# Patient Record
Sex: Female | Born: 1990 | Race: White | Hispanic: No | Marital: Single | Smoking: Never smoker
Health system: Southern US, Community
[De-identification: ages and names within clinical notes are randomized; demographics above are authoritative.]

## PROBLEM LIST (undated history)

## (undated) DIAGNOSIS — F419 Anxiety disorder, unspecified: Secondary | ICD-10-CM

## (undated) DIAGNOSIS — F32A Depression, unspecified: Secondary | ICD-10-CM

## (undated) DIAGNOSIS — F329 Major depressive disorder, single episode, unspecified: Secondary | ICD-10-CM

## (undated) HISTORY — PX: OTHER SURGICAL HISTORY: SHX169

---
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
## 2010-05-09 ENCOUNTER — Emergency Department (HOSPITAL_COMMUNITY): Admission: EM | Admit: 2010-05-09 | Discharge: 2010-05-09 | Payer: Self-pay | Admitting: Emergency Medicine

## 2010-11-26 LAB — DIFFERENTIAL
Lymphocytes Relative: 7 % — ABNORMAL LOW (ref 12–46)
Lymphs Abs: 1.2 10*3/uL (ref 0.7–4.0)
Neutrophils Relative %: 90 % — ABNORMAL HIGH (ref 43–77)

## 2010-11-26 LAB — URINALYSIS, ROUTINE W REFLEX MICROSCOPIC
Bilirubin Urine: NEGATIVE
Ketones, ur: 15 mg/dL — AB
Nitrite: NEGATIVE
Protein, ur: NEGATIVE mg/dL
pH: 5.5 (ref 5.0–8.0)

## 2010-11-26 LAB — POCT PREGNANCY, URINE

## 2010-11-26 LAB — BASIC METABOLIC PANEL
CO2: 21 mEq/L (ref 19–32)
Calcium: 9.3 mg/dL (ref 8.4–10.5)
GFR calc non Af Amer: >60 mL/min (ref >60)
Sodium: 137 mEq/L (ref 135–145)

## 2010-11-26 LAB — CBC
MCV: 92.2 fL (ref 78.0–100.0)
Platelets: 261 10*3/uL (ref 150–400)
RBC: 4.02 MIL/uL (ref 3.87–5.11)
WBC: 16.3 10*3/uL — ABNORMAL HIGH (ref 4.0–10.5)

## 2010-11-26 LAB — URINE MICROSCOPIC-ADD ON

## 2010-11-28 LAB — URINALYSIS, ROUTINE W REFLEX MICROSCOPIC
Glucose, UA: NEGATIVE mg/dL
Specific Gravity, Urine: 1.025 (ref 1.005–1.030)
pH: 6 (ref 5.0–8.0)

## 2010-11-28 LAB — GC/CHLAMYDIA PROBE AMP, GENITAL

## 2010-11-28 LAB — POCT I-STAT, CHEM 8
BUN: 4 mg/dL — ABNORMAL LOW (ref 6–23)
Chloride: 105 mEq/L (ref 96–112)
Sodium: 141 mEq/L (ref 135–145)

## 2010-11-28 LAB — WET PREP, GENITAL
Trich, Wet Prep: NONE SEEN
Yeast Wet Prep HPF POC: NONE SEEN
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
## 2010-12-08 IMAGING — CR DG HAND 2V*L*
2 series · 2 of 2 positions shown · non-contrast
Comparison: None

CLINICAL DATA: Pain after trauma

LEFT HAND - 2 VIEW

[view not recorded (1 of 2)]
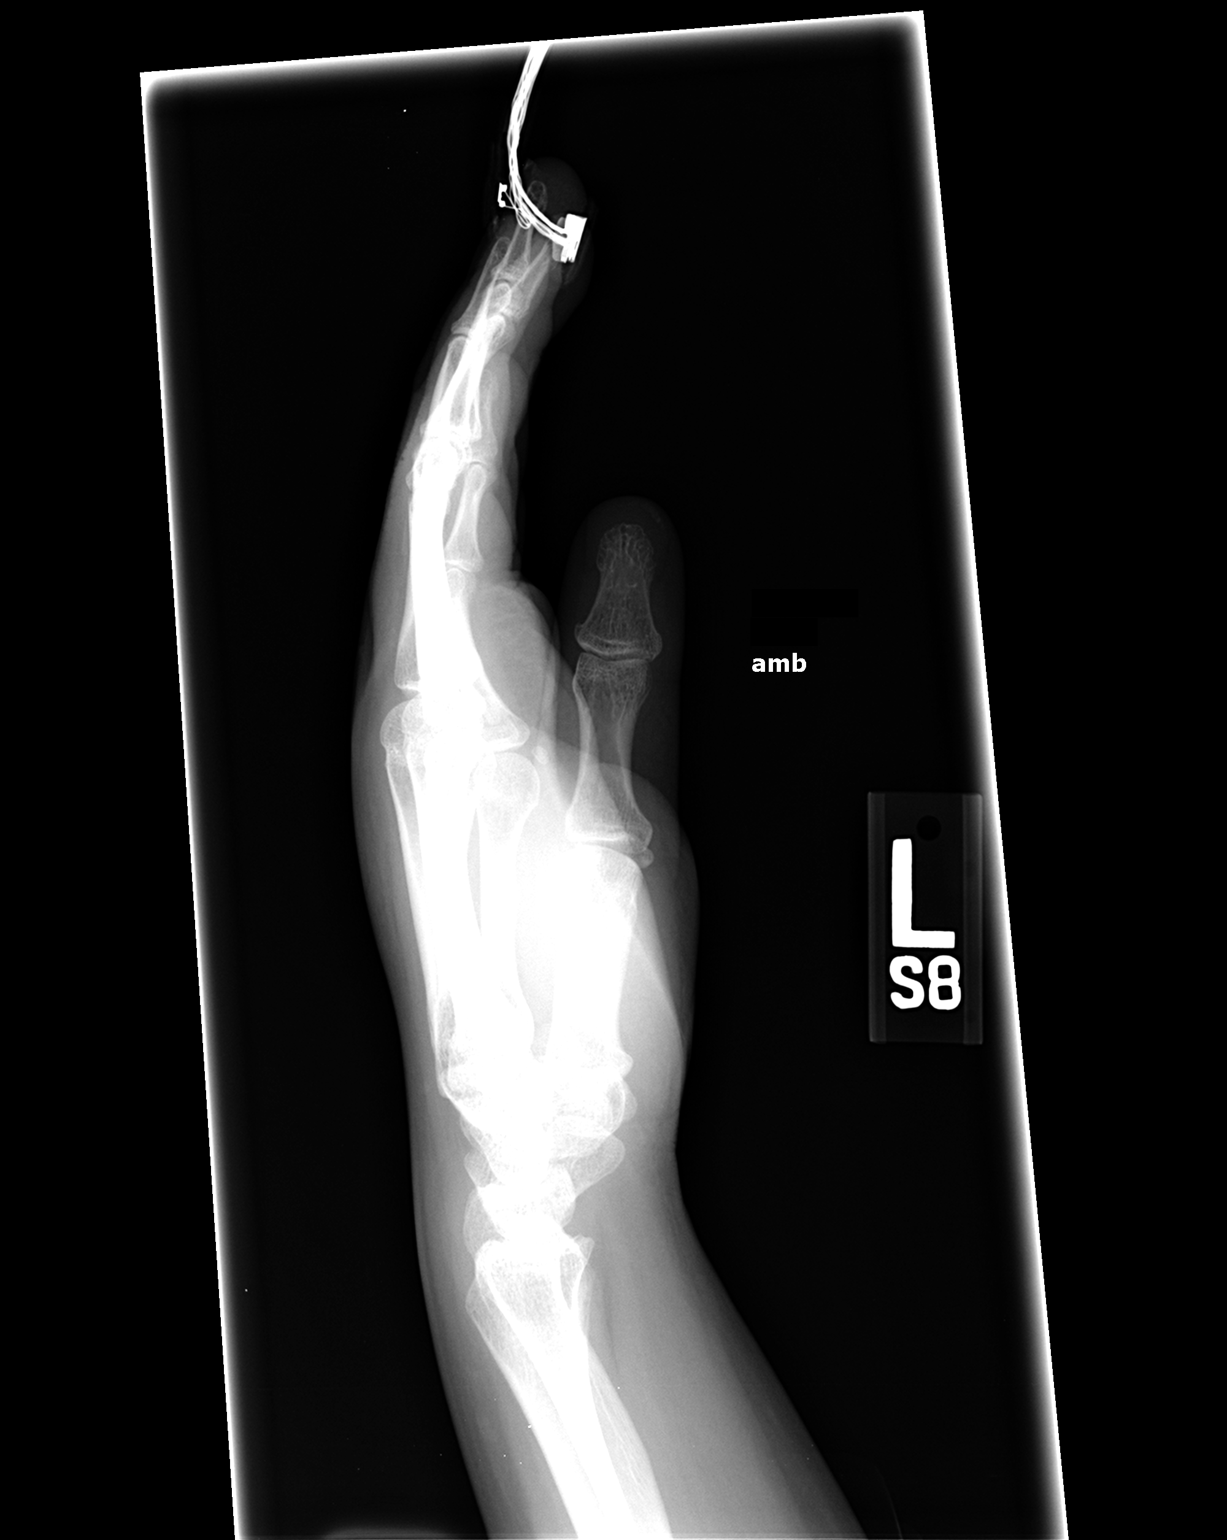

[view not recorded (2 of 2)]
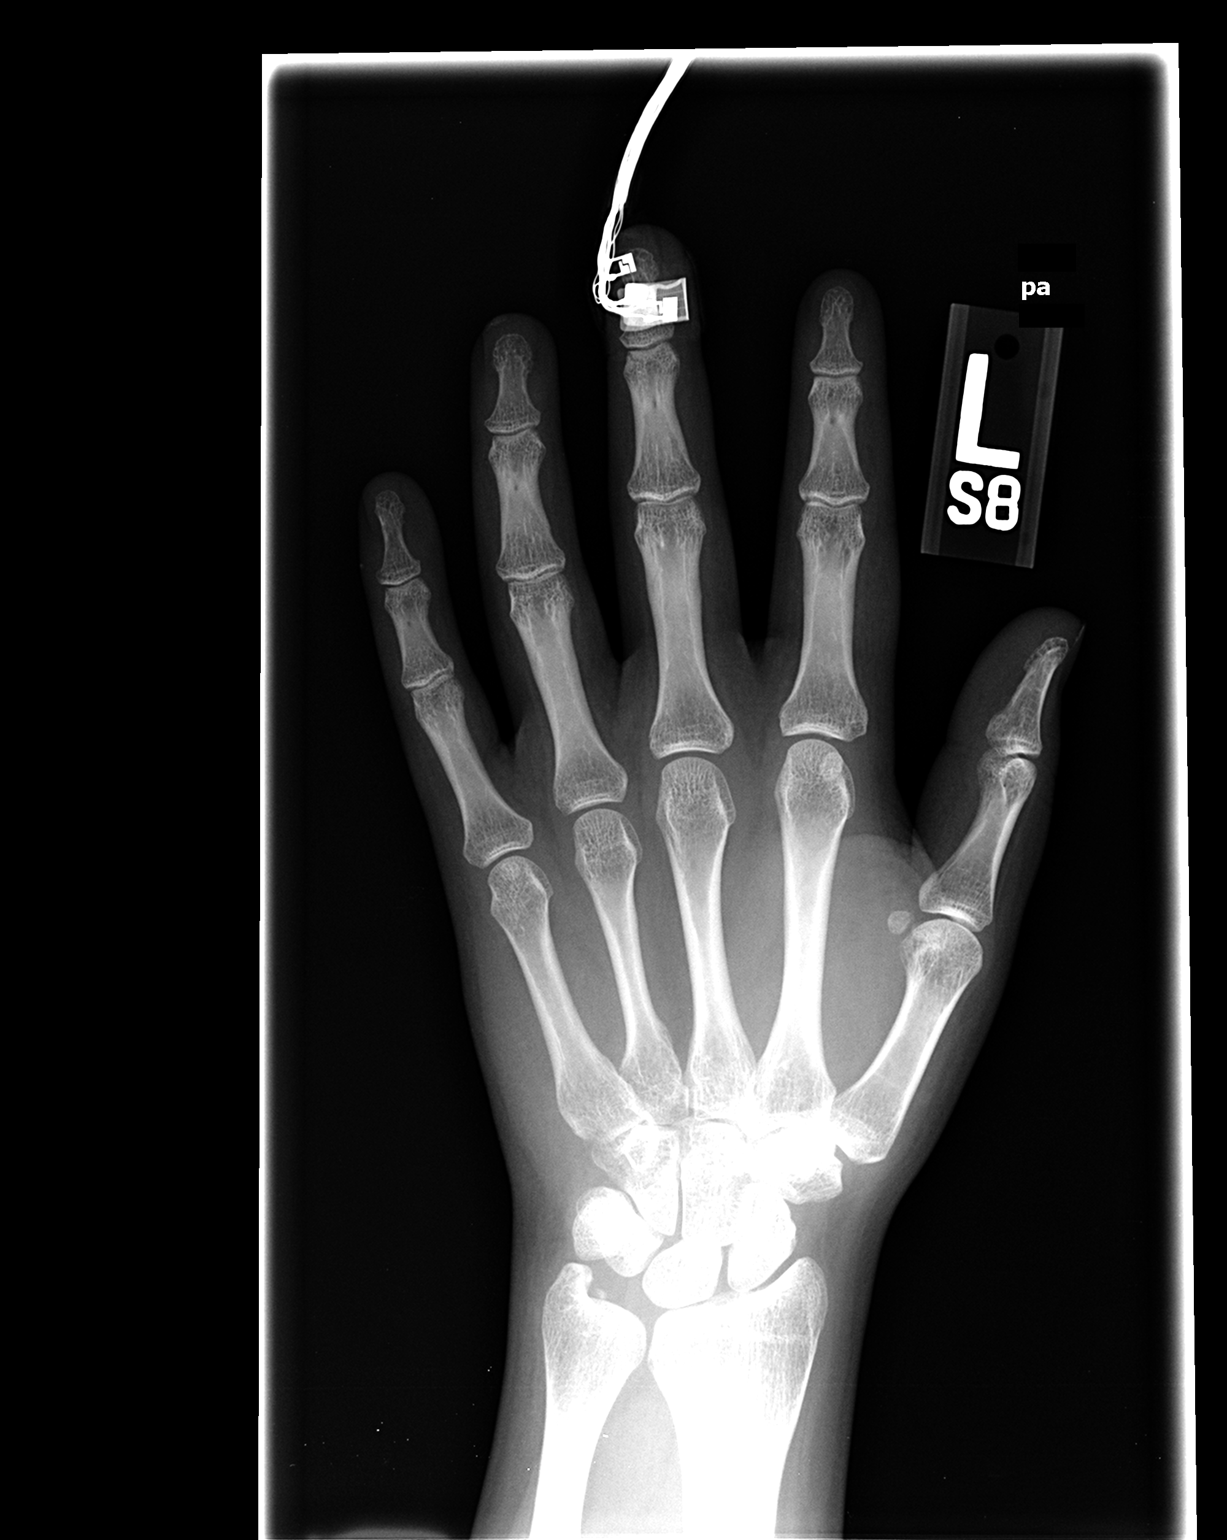

[2 of 2 positions shown; findings below may reference images not displayed]

FINDINGS: There is no evidence of fracture or dislocation.  A
small, well-corticated loose body is noted within the ulnar carpal
joint.
IMPRESSION: No evidence of acute osseous injury.

## 2010-12-13 LAB — BASIC METABOLIC PANEL
CO2: 26 mEq/L (ref 19–32)
Calcium: 9.8 mg/dL (ref 8.4–10.5)
GFR calc non Af Amer: >60 mL/min (ref >60)
Sodium: 138 mEq/L (ref 135–145)

## 2010-12-13 LAB — URINALYSIS, ROUTINE W REFLEX MICROSCOPIC
Glucose, UA: NEGATIVE mg/dL
Ketones, ur: NEGATIVE mg/dL
Protein, ur: NEGATIVE mg/dL
pH: 6 (ref 5.0–8.0)

## 2010-12-13 LAB — HCG, QUANTITATIVE, PREGNANCY

## 2010-12-13 LAB — DIFFERENTIAL
Lymphocytes Relative: 34 % (ref 12–46)
Lymphs Abs: 3 10*3/uL (ref 0.7–4.0)
Monocytes Relative: 7 % (ref 3–12)
Neutro Abs: 5.2 10*3/uL (ref 1.7–7.7)
Neutrophils Relative %: 57 % (ref 43–77)

## 2010-12-13 LAB — URINE MICROSCOPIC-ADD ON

## 2010-12-13 LAB — CBC
MCV: 93.2 fL (ref 78.0–100.0)
Platelets: 337 10*3/uL (ref 150–400)
RBC: 3.77 MIL/uL — ABNORMAL LOW (ref 3.87–5.11)
WBC: 9.1 10*3/uL (ref 4.0–10.5)

## 2010-12-13 LAB — ABO/RH: ABO/RH(D): O POS

## 2010-12-15 LAB — BASIC METABOLIC PANEL
BUN: 1 mg/dL — ABNORMAL LOW (ref 6–23)
BUN: 2 mg/dL — ABNORMAL LOW (ref 6–23)
BUN: 6 mg/dL (ref 6–23)
Calcium: 7.6 mg/dL — ABNORMAL LOW (ref 8.4–10.5)
Calcium: 8.5 mg/dL (ref 8.4–10.5)
Chloride: 106 mEq/L (ref 96–112)
Creatinine, Ser: 0.74 mg/dL (ref 0.4–1.2)
GFR calc Af Amer: >60 mL/min (ref >60)
GFR calc non Af Amer: >60 mL/min (ref >60)
GFR calc non Af Amer: >60 mL/min (ref >60)
GFR calc non Af Amer: >60 mL/min (ref >60)
Potassium: 3.6 mEq/L (ref 3.5–5.1)
Potassium: 3.8 mEq/L (ref 3.5–5.1)
Potassium: 3.9 mEq/L (ref 3.5–5.1)
Sodium: 136 mEq/L (ref 135–145)
Sodium: 137 mEq/L (ref 135–145)

## 2010-12-15 LAB — PROTIME-INR
INR: 1.24 (ref 0.00–1.49)
INR: 1.11 (ref 0.00–1.49)
Prothrombin Time: 15.5 seconds — ABNORMAL HIGH (ref 11.6–15.2)
Prothrombin Time: 14.2 seconds (ref 11.6–15.2)

## 2010-12-15 LAB — GLUCOSE, CAPILLARY
Glucose-Capillary: 123 mg/dL — ABNORMAL HIGH (ref 70–99)
Glucose-Capillary: 128 mg/dL — ABNORMAL HIGH (ref 70–99)
Glucose-Capillary: 140 mg/dL — ABNORMAL HIGH (ref 70–99)

## 2010-12-15 LAB — CBC
HCT: 27.3 % — ABNORMAL LOW (ref 36.0–46.0)
HCT: 29.8 % — ABNORMAL LOW (ref 36.0–46.0)
HCT: 30.1 % — ABNORMAL LOW (ref 36.0–46.0)
HCT: 31.9 % — ABNORMAL LOW (ref 36.0–46.0)
HCT: 32.9 % — ABNORMAL LOW (ref 36.0–46.0)
Hemoglobin: 10.6 g/dL — ABNORMAL LOW (ref 12.0–15.0)
Hemoglobin: 11.7 g/dL — ABNORMAL LOW (ref 12.0–15.0)
MCHC: 35.6 g/dL (ref 30.0–36.0)
MCHC: 35.5 g/dL (ref 30.0–36.0)
MCV: 92.1 fL (ref 78.0–100.0)
MCV: 92.5 fL (ref 78.0–100.0)
Platelets: 143 10*3/uL — ABNORMAL LOW (ref 150–400)
Platelets: 151 10*3/uL (ref 150–400)
Platelets: 164 10*3/uL (ref 150–400)
Platelets: 181 10*3/uL (ref 150–400)
Platelets: 214 10*3/uL (ref 150–400)
Platelets: 425 10*3/uL — ABNORMAL HIGH (ref 150–400)
Platelets: 306 10*3/uL (ref 150–400)
RBC: 2.69 MIL/uL — ABNORMAL LOW (ref 3.87–5.11)
RBC: 3.22 MIL/uL — ABNORMAL LOW (ref 3.87–5.11)
RDW: 13.1 % (ref 11.5–15.5)
RDW: 13.3 % (ref 11.5–15.5)
RDW: 13.4 % (ref 11.5–15.5)
RDW: 14 % (ref 11.5–15.5)
RDW: 12.2 % (ref 11.5–15.5)
WBC: 11.4 10*3/uL — ABNORMAL HIGH (ref 4.0–10.5)
WBC: 14.3 10*3/uL — ABNORMAL HIGH (ref 4.0–10.5)
WBC: 8.4 10*3/uL (ref 4.0–10.5)
WBC: 9.4 10*3/uL (ref 4.0–10.5)

## 2010-12-15 LAB — URINALYSIS, MICROSCOPIC ONLY
Nitrite: NEGATIVE
Protein, ur: NEGATIVE mg/dL
Specific Gravity, Urine: 1.019 (ref 1.005–1.030)
Urobilinogen, UA: 1 mg/dL (ref 0.0–1.0)

## 2010-12-15 LAB — TYPE AND SCREEN: Antibody Screen: NEGATIVE

## 2010-12-15 LAB — ABO/RH: ABO/RH(D): O POS

## 2010-12-15 LAB — URINE CULTURE: Colony Count: 45000

## 2010-12-15 LAB — COMPREHENSIVE METABOLIC PANEL
ALT: 15 U/L (ref 0–35)
Albumin: 3.6 g/dL (ref 3.5–5.2)
Alkaline Phosphatase: 56 U/L (ref 39–117)
Calcium: 8 mg/dL — ABNORMAL LOW (ref 8.4–10.5)
Glucose, Bld: 197 mg/dL — ABNORMAL HIGH (ref 70–99)
Potassium: 2.9 mEq/L — ABNORMAL LOW (ref 3.5–5.1)
Sodium: 136 mEq/L (ref 135–145)
Total Protein: 6.2 g/dL (ref 6.0–8.3)

## 2010-12-15 LAB — PREGNANCY, URINE: Preg Test, Ur: NEGATIVE

## 2010-12-15 LAB — PREPARE RBC (CROSSMATCH)

## 2010-12-15 LAB — POCT CARDIAC MARKERS
CKMB, poc: 2 ng/mL (ref 1.0–8.0)
Myoglobin, poc: 380 ng/mL (ref 12–200)
Troponin i, poc: <0.05 ng/mL (ref 0.00–0.09)

## 2010-12-15 LAB — POCT I-STAT, CHEM 8
BUN: 10 mg/dL (ref 6–23)
Creatinine, Ser: 0.8 mg/dL (ref 0.4–1.2)
Sodium: 142 meq/L (ref 135–145)
TCO2: 21 mmol/L (ref 0–100)

## 2010-12-17 LAB — PREGNANCY, URINE: Preg Test, Ur: NEGATIVE

## 2010-12-22 LAB — BASIC METABOLIC PANEL
CO2: 27 mEq/L (ref 19–32)
Chloride: 104 mEq/L (ref 96–112)
Glucose, Bld: 93 mg/dL (ref 70–99)
Potassium: 3.9 mEq/L (ref 3.5–5.1)
Sodium: 139 mEq/L (ref 135–145)

## 2010-12-22 LAB — URINALYSIS, ROUTINE W REFLEX MICROSCOPIC
Bilirubin Urine: NEGATIVE
Ketones, ur: >80 mg/dL — AB
Nitrite: POSITIVE — AB
Protein, ur: 100 mg/dL — AB
Specific Gravity, Urine: 1.015 (ref 1.005–1.030)
Urobilinogen, UA: 1 mg/dL (ref 0.0–1.0)

## 2010-12-22 LAB — CBC
HCT: 36.7 % (ref 36.0–49.0)
Hemoglobin: 13.1 g/dL (ref 12.0–16.0)
MCHC: 35.8 g/dL (ref 31.0–37.0)
RBC: 3.98 MIL/uL (ref 3.80–5.70)
RDW: 12 % (ref 11.4–15.5)

## 2010-12-22 LAB — PREGNANCY, URINE: Preg Test, Ur: NEGATIVE

## 2010-12-23 LAB — DIFFERENTIAL
Basophils Relative: 0 % (ref 0–1)
Eosinophils Absolute: 0.2 10*3/uL (ref 0.0–1.2)
Eosinophils Relative: 2 % (ref 0–5)
Lymphs Abs: 2 10*3/uL (ref 1.1–4.8)
Monocytes Absolute: 0.5 10*3/uL (ref 0.2–1.2)
Monocytes Relative: 5 % (ref 3–11)
Neutrophils Relative %: 68 % (ref 43–71)

## 2010-12-23 LAB — URINE MICROSCOPIC-ADD ON

## 2010-12-23 LAB — URINE CULTURE: Colony Count: NO GROWTH

## 2010-12-23 LAB — CBC
HCT: 36 % (ref 36.0–49.0)
MCHC: 35.6 g/dL (ref 31.0–37.0)
MCV: 93 fL (ref 78.0–98.0)
RBC: 3.87 MIL/uL (ref 3.80–5.70)
WBC: 8.6 10*3/uL (ref 4.5–13.5)

## 2010-12-23 LAB — URINALYSIS, ROUTINE W REFLEX MICROSCOPIC
Bilirubin Urine: NEGATIVE
Glucose, UA: NEGATIVE mg/dL
Ketones, ur: NEGATIVE mg/dL
Leukocytes, UA: NEGATIVE
Specific Gravity, Urine: 1.025 (ref 1.005–1.030)
pH: 6 (ref 5.0–8.0)

## 2011-01-25 NOTE — H&P (Signed)
NAME:  Gloria Reed, Gloria Reed             ACCOUNT NO.:  [id number]   MEDICAL RECORD NO.:  [id number]         PATIENT TYPE:  PAMB   LOCATION:  DAY                           FACILITY:  [facility]   PHYSICIAN:  Dennie Maizes, M.D.   DATE OF BIRTH:  09/20/1990   DATE OF ADMISSION:  DATE OF DISCHARGE:  LH                              HISTORY & PHYSICAL   CHIEF COMPLAINT:  Right flank pain, right distal ureteral calculus  obstruction.   HISTORY OF PRESENT ILLNESS:  This 20 year old female has a history of  recurrent urolithiasis in the past.  The patient has been having  intermittent right flank pain for the past month.  She also has  intermittent hematuria.  Initial x-rays revealed 2 small right renal  calculi.  Recent KUB revealed a right distal ureteral calculus.  The  patient is unable to pass the stone.  She has no fever, chills or  voiding difficulty at present.  She is brought to the short-stay center  today for cystoscopy, retrograde pyelogram, ureteroscopy, stone  extraction and stent placement.   She has been treated for urinary tract infection with Macrobid in the  past.   PAST MEDICAL HISTORY:  1. History of urolithiasis.  2. History of abortion in 2008.  3. Depression.  4. Recurrent UTIs.   MEDICATIONS:  Percocet p.r.n. for pain.   ALLERGIES:  PENICILLIN, SULFA and CECLOR.   FAMILY HISTORY:  Positive for diabetes mellitus, heart disease, renal  disease and kidney stones.   EXAMINATION:  Height 4 feet 1, weight 110 pounds.  HEAD, EYES, EARS, NOSE AND THROAT:  Normal.  LUNGS:  Clear to auscultation.  HEART:  Regular rate and rhythm.  No murmurs.  ABDOMEN:  Soft, no palpable flank mass.  Bilateral flank costovertebral  angle tenderness.  Her bladder is not palpable.   IMPRESSION:  Right distal ureteral calculus with obstruction, right  renal colic.   PLAN:  Cystoscopy, right retrograde pyelogram, right ureteroscopy, stone  extraction and stent placement in short-stay center.   I have discussed  with the patient and her family regarding the diagnosis, operative  details, alternative treatments, the outcome, possible risks and  complications, and she has agreed for the procedure to be done.      Dennie Maizes, M.D.  Electronically Signed     SK/MEDQ  D:  12/24/2008  T:  12/24/2008  Job:  [id number]   cc:   [facility]

## 2011-01-25 NOTE — Op Note (Signed)
NAMEVALEDA, CORZINE               ACCOUNT NO.:  [id number]   MEDICAL RECORD NO.:  [id number]          PATIENT TYPE:  AMB   LOCATION:  DAY                           FACILITY:  [facility]   PHYSICIAN:  Dennie Maizes, M.D.   DATE OF BIRTH:  Apr 13, 1991   DATE OF PROCEDURE:  12/24/2008  DATE OF DISCHARGE:                               OPERATIVE REPORT   PREOPERATIVE DIAGNOSIS:  Right distal ureteral calculus obstruction,  right renal colic.   POSTOPERATIVE DIAGNOSIS:  Right distal ureteral calculus obstruction,  right renal colic.   OPERATIVE PROCEDURE:  Cystoscopy, right retrograde pyelogram, right  ureteroscopy, stone extraction, and right ureteral stent placement.   ANESTHESIA:  General.   SURGEON:  Dennie Maizes, MD   COMPLICATIONS:  None.   ESTIMATED BLOOD LOSS:  Minimal.   DRAINS:  6.26 cm size right ureteral stent with a string.   SPECIMENS:  None.   INDICATIONS FOR PROCEDURE:  A 20 year old female who has recurrent  urolithiasis.  She was evaluated for severe right flank pain.  X-rays  revealed 3-4 mm right distal ureteral calculus with obstruction.  The  patient was unable to pass the stone.  She was taken to the operating  room today for cystoscopy, right retrograde pyelogram, right  ureteroscopy, stone extraction, and stent placement.   DESCRIPTION OF PROCEDURE:  General anesthesia was induced and the  patient was placed on the OR table in the dorsal lithotomy position.  Lower abdomen and genitalia were prepped and draped in a sterile  fashion.  Cystoscopy was done with a 25-French scope.  Appearance of  bladder was normal.  A 5-French Wedge catheter was then placed in the  right ureteral orifice.  About 7 mL of Renografin-60 was injected in the  collecting system and retrograde pyelogram was done with C-arm  fluoroscopy.  There was a filling defect in the distal ureter in the  ureterovesical junction about 4 mm in size.  There was proximal mild  hydroureter and  hydronephrosis.  A 5-French open-ended catheter was then  placed in the right distal ureter.  A 0.038-gauge Bentson guide was then  inserted into the right collecting system.  Distal ureter was dilated  using a 6 cm 16-French balloon dilating catheter.  The balloon dilating  catheter was then removed leaving the guidewire in place.   Ureteroscopy was done with an 8-French rigid ureteroscope.  The stone  was seen at the level of about 2 cm above the ureteral orifice.  The  stone was engaged in the wire basket and removed without any difficulty.  During removal,  the stone fell into the drapes and I could not review the stone  afterwards.  Examination of the bladder was normal.  A 6-French 26-cm  size stent was then inserted in the right collecting system.  The  patient was then transferred to PACU in a satisfactory condition.      Dennie Maizes, M.D.  Electronically Signed     SK/MEDQ  D:  12/24/2008  T:  12/25/2008  Job:  [id number]   cc:   Health Department [facility]  [place]

## 2013-06-20 ENCOUNTER — Encounter (HOSPITAL_COMMUNITY): Payer: Self-pay | Admitting: Emergency Medicine

## 2013-06-20 ENCOUNTER — Emergency Department (HOSPITAL_COMMUNITY)
Admission: EM | Admit: 2013-06-20 | Discharge: 2013-06-21 | Disposition: A | Discharge status: Home / Self Care | Payer: Self-pay | Attending: Emergency Medicine | Admitting: Emergency Medicine

## 2013-06-20 DIAGNOSIS — R51 Headache: Secondary | ICD-10-CM | POA: Insufficient documentation

## 2013-06-20 DIAGNOSIS — Z880 Allergy status to penicillin: Secondary | ICD-10-CM | POA: Insufficient documentation

## 2013-06-20 MED ORDER — ONDANSETRON 8 MG PO TBDP
8.0000 mg | ORAL_TABLET | Freq: Once | ORAL | Status: AC
  Administered 2013-06-21: 8 mg via ORAL
  Filled 2013-06-20: qty 1

## 2013-06-20 MED ORDER — ACETAMINOPHEN 500 MG PO TABS
1000.0000 mg | ORAL_TABLET | Freq: Once | ORAL | Status: AC
  Administered 2013-06-20: 1000 mg via ORAL
  Filled 2013-06-20: qty 2

## 2013-06-20 MED ORDER — DIPHENHYDRAMINE HCL 25 MG PO CAPS
25.0000 mg | ORAL_CAPSULE | Freq: Once | ORAL | Status: AC
  Administered 2013-06-21: 25 mg via ORAL
  Filled 2013-06-20: qty 1

## 2013-06-20 NOTE — ED Notes (Signed)
Headache for 1 month,  No recent HI,  Nausea, ,diarrhea.for 1 week
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
## 2013-06-21 MED ORDER — ONDANSETRON HCL 4 MG PO TABS: 4.0000 mg | ORAL_TABLET | Freq: Four times a day (QID) | ORAL | Status: DC

## 2013-06-21 MED ORDER — BUTALBITAL-APAP-CAFFEINE 50-325-40 MG PO TABS: 1.0000 | ORAL_TABLET | Freq: Four times a day (QID) | ORAL | Status: DC | PRN

## 2013-06-21 NOTE — ED Provider Notes (Signed)
Medical screening examination/treatment/procedure(s) were performed by non-physician practitioner and as supervising physician I was immediately available for consultation/collaboration.   Glynn Octave, MD 06/21/13 0330

## 2014-02-08 ENCOUNTER — Encounter (HOSPITAL_COMMUNITY): Payer: Self-pay | Admitting: Emergency Medicine

## 2014-02-08 ENCOUNTER — Emergency Department (HOSPITAL_COMMUNITY): Payer: Self-pay

## 2014-02-08 ENCOUNTER — Emergency Department (HOSPITAL_COMMUNITY)
Admission: EM | Admit: 2014-02-08 | Discharge: 2014-02-08 | Disposition: A | Discharge status: Home / Self Care | Payer: Self-pay | Attending: Emergency Medicine | Admitting: Emergency Medicine

## 2014-02-08 DIAGNOSIS — S51009A Unspecified open wound of unspecified elbow, initial encounter: Secondary | ICD-10-CM | POA: Insufficient documentation

## 2014-02-08 DIAGNOSIS — W268XXA Contact with other sharp object(s), not elsewhere classified, initial encounter: Secondary | ICD-10-CM | POA: Insufficient documentation

## 2014-02-08 DIAGNOSIS — F3289 Other specified depressive episodes: Secondary | ICD-10-CM | POA: Insufficient documentation

## 2014-02-08 DIAGNOSIS — Z880 Allergy status to penicillin: Secondary | ICD-10-CM | POA: Insufficient documentation

## 2014-02-08 DIAGNOSIS — Z3202 Encounter for pregnancy test, result negative: Secondary | ICD-10-CM | POA: Insufficient documentation

## 2014-02-08 DIAGNOSIS — F411 Generalized anxiety disorder: Secondary | ICD-10-CM | POA: Insufficient documentation

## 2014-02-08 DIAGNOSIS — F329 Major depressive disorder, single episode, unspecified: Secondary | ICD-10-CM | POA: Insufficient documentation

## 2014-02-08 DIAGNOSIS — Y9389 Activity, other specified: Secondary | ICD-10-CM | POA: Insufficient documentation

## 2014-02-08 DIAGNOSIS — S51012A Laceration without foreign body of left elbow, initial encounter: Secondary | ICD-10-CM

## 2014-02-08 DIAGNOSIS — Y929 Unspecified place or not applicable: Secondary | ICD-10-CM | POA: Insufficient documentation

## 2014-02-08 HISTORY — DX: Depression, unspecified: F32.A

## 2014-02-08 HISTORY — DX: Major depressive disorder, single episode, unspecified: F32.9

## 2014-02-08 HISTORY — DX: Anxiety disorder, unspecified: F41.9

## 2014-02-08 NOTE — ED Provider Notes (Signed)
Medical screening examination/treatment/procedure(s) were performed by non-physician practitioner and as supervising physician I was immediately available for consultation/collaboration.   EKG Interpretation None        Joya Gaskins, MD 02/08/14 2359
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
## 2014-02-08 NOTE — Discharge Instructions (Signed)
The x-ray of your elbow is negative for any bone involvement. There is no foreign body noted in the elbow. Your wound was repaired with 4 staples. Please keep the wound clean and dry. Please have the staples removed in 7 days. Please see your primary physician, or return to the emergency department if any signs of infection. Stitches, Staples, or Skin Adhesive Strips  Stitches (sutures), staples, and skin adhesive strips hold the skin together as it heals. They will usually be in place for 7 days or less. HOME CARE  Wash your hands with soap and water before and after you touch your wound.  Only take medicine as told by your doctor.  Cover your wound only if your doctor told you to. Otherwise, leave it open to air.  Do not get your stitches wet or dirty. If they get dirty, dab them gently with a clean washcloth. Wet the washcloth with soapy water. Do not rub. Pat them dry gently.  Do not put medicine or medicated cream on your stitches unless your doctor told you to.  Do not take out your own stitches or staples. Skin adhesive strips will fall off by themselves.  Do not pick at the wound. Picking can cause an infection.  Do not miss your follow-up appointment.  If you have problems or questions, call your doctor. GET HELP RIGHT AWAY IF:   You have a temperature by mouth above 102 F (38.9 C), not controlled by medicine.  You have chills.  You have redness or pain around your stitches.  There is puffiness (swelling) around your stitches.  You notice fluid (drainage) from your stitches.  There is a bad smell coming from your wound. MAKE SURE YOU:  Understand these instructions.  Will watch your condition.  Will get help if you are not doing well or get worse. Document Released: 06/26/2009 Document Revised: 11/21/2011 Document Reviewed: 06/26/2009 [facility] Patient Information 2014 [place], [place].

## 2014-02-10 ENCOUNTER — Emergency Department (HOSPITAL_COMMUNITY)
Admission: EM | Admit: 2014-02-10 | Discharge: 2014-02-10 | Disposition: A | Discharge status: Home / Self Care | Payer: Self-pay | Attending: Emergency Medicine | Admitting: Emergency Medicine

## 2014-02-10 ENCOUNTER — Encounter (HOSPITAL_COMMUNITY): Payer: Self-pay | Admitting: Emergency Medicine

## 2014-02-10 DIAGNOSIS — Z8659 Personal history of other mental and behavioral disorders: Secondary | ICD-10-CM | POA: Insufficient documentation

## 2014-02-10 DIAGNOSIS — S51012A Laceration without foreign body of left elbow, initial encounter: Secondary | ICD-10-CM

## 2014-02-10 DIAGNOSIS — Z880 Allergy status to penicillin: Secondary | ICD-10-CM | POA: Insufficient documentation

## 2014-02-10 DIAGNOSIS — Z4801 Encounter for change or removal of surgical wound dressing: Secondary | ICD-10-CM | POA: Insufficient documentation

## 2014-02-10 DIAGNOSIS — Z87828 Personal history of other (healed) physical injury and trauma: Secondary | ICD-10-CM | POA: Insufficient documentation

## 2014-02-10 LAB — POC URINE PREG, ED: PREG TEST UR: NEGATIVE

## 2014-02-10 MED ORDER — TRAMADOL HCL 50 MG PO TABS: 50.0000 mg | ORAL_TABLET | Freq: Four times a day (QID) | ORAL | Status: DC | PRN

## 2014-02-10 MED ORDER — IBUPROFEN 600 MG PO TABS: 600.0000 mg | ORAL_TABLET | Freq: Four times a day (QID) | ORAL | Status: DC | PRN
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
## 2014-02-10 NOTE — ED Notes (Signed)
Patient with no complaints at this time. Respirations even and unlabored. Skin warm/dry. Discharge instructions reviewed with patient at this time. Patient given opportunity to voice concerns/ask questions. Patient discharged at this time and left Emergency Department with steady gait.  Wound care discussed with patient. Gave verbal understanding of dressing wound and s/s of infection. Advised to return for staple removal in 7 days per PA
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
## 2014-02-11 NOTE — ED Provider Notes (Signed)
Medical screening examination/treatment/procedure(s) were performed by non-physician practitioner and as supervising physician I was immediately available for consultation/collaboration.   EKG Interpretation None       Kristee Angus R. Maliq Pilley, MD 02/11/14 1527 

## 2014-02-15 ENCOUNTER — Encounter (HOSPITAL_COMMUNITY): Payer: Self-pay | Admitting: Emergency Medicine

## 2014-02-15 ENCOUNTER — Emergency Department (HOSPITAL_COMMUNITY)
Admission: EM | Admit: 2014-02-15 | Discharge: 2014-02-15 | Disposition: A | Discharge status: Home / Self Care | Payer: Self-pay | Attending: Emergency Medicine | Admitting: Emergency Medicine

## 2014-02-15 DIAGNOSIS — Z4801 Encounter for change or removal of surgical wound dressing: Secondary | ICD-10-CM | POA: Insufficient documentation

## 2014-02-15 DIAGNOSIS — F329 Major depressive disorder, single episode, unspecified: Secondary | ICD-10-CM | POA: Insufficient documentation

## 2014-02-15 DIAGNOSIS — F3289 Other specified depressive episodes: Secondary | ICD-10-CM | POA: Insufficient documentation

## 2014-02-15 DIAGNOSIS — Z5189 Encounter for other specified aftercare: Secondary | ICD-10-CM

## 2014-02-15 DIAGNOSIS — Z880 Allergy status to penicillin: Secondary | ICD-10-CM | POA: Insufficient documentation

## 2014-02-15 DIAGNOSIS — F411 Generalized anxiety disorder: Secondary | ICD-10-CM | POA: Insufficient documentation

## 2014-02-15 NOTE — Discharge Instructions (Signed)
Return to the ED in another 4-5 days to have your wound rechecked to see if the staples can be removed. No heavy lifting or straining.
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
## 2014-02-15 NOTE — ED Notes (Signed)
Pt c/o staple removal from left elbow that was placed on 02/08/2014, denies any complaints at present,

## 2014-02-15 NOTE — ED Notes (Signed)
Pt has 4 staples to left elbow here for removal, site WNL

## 2014-02-20 ENCOUNTER — Encounter (HOSPITAL_COMMUNITY): Payer: Self-pay | Admitting: Emergency Medicine

## 2014-02-20 ENCOUNTER — Emergency Department (HOSPITAL_COMMUNITY)
Admission: EM | Admit: 2014-02-20 | Discharge: 2014-02-20 | Disposition: A | Discharge status: Home / Self Care | Payer: Self-pay | Attending: Emergency Medicine | Admitting: Emergency Medicine

## 2014-02-20 DIAGNOSIS — Z8659 Personal history of other mental and behavioral disorders: Secondary | ICD-10-CM | POA: Insufficient documentation

## 2014-02-20 DIAGNOSIS — Z880 Allergy status to penicillin: Secondary | ICD-10-CM | POA: Insufficient documentation

## 2014-02-20 DIAGNOSIS — Z4802 Encounter for removal of sutures: Secondary | ICD-10-CM | POA: Insufficient documentation

## 2014-02-20 NOTE — Care Management Note (Signed)
Patient was noted to not have a PCP listed, but per patient PCP is [facility]. Entered this into computer.information
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
## 2014-02-20 NOTE — Discharge Instructions (Signed)
  Staple Removal Care After The staples used to close your skin have been removed. The wound needs continued care so it can heal completely and without problems. The care described here will need to be done for another 5-10 days unless your caregiver advises otherwise.  HOME CARE INSTRUCTIONS   Keep wound site dry and clean.  If skin adhesive strips were applied after the staples were removed, they will begin to peel off in a few days. If they remain after fourteen days, they may be peeled off and discarded.  If you still have a dressing, change it at least once a day or as instructed by your caregiver. If the bandage sticks, soak it off with warm water. Pat dry with a clean towel. Look for signs of infection (see below).  Reapply cream or ointment according to your caregiver's instruction. This will help prevent infection and keep the bandage from sticking. Use of a non-stick material over the wound and under the dressing or wrap will also help keep the bandage from sticking.  If the bandage becomes wet, dirty or develops a foul smell, change it as soon as possible.  New scars become sunburned easily. Use sunscreens with protection factor (SPF) of at least 15 when out in the sun.  Only take over-the-counter or prescription medicines for pain, discomfort or fever as directed by your caregiver. SEEK IMMEDIATE MEDICAL CARE IF:   There is redness, swelling or increasing pain in the wound.  Pus is coming from the wound.  An unexplained oral temperature above 102 F (38.9 C) develops.  You notice a foul smell coming from the wound or dressing.  There is a breaking open of the suture line (edges not staying together) of the wound edges after staples have been removed. Document Released: 08/11/2008 Document Revised: 11/21/2011 Document Reviewed: 08/11/2008 [employer] Patient Information 2014 [employer].  

## 2014-02-20 NOTE — ED Notes (Signed)
Pt states she is here for staple removal which were placed to left elbow placed ~ 15 days ago.

## 2014-02-20 NOTE — ED Notes (Signed)
H. Bryant, PA at bedside. 

## 2014-02-24 NOTE — ED Provider Notes (Signed)
Medical screening examination/treatment/procedure(s) were performed by non-physician practitioner and as supervising physician I was immediately available for consultation/collaboration.   EKG Interpretation None      Devoria AlbeIva Peregrine Nolt, MD, Armando GangFACEP   Ward GivensIva L Marjon Doxtater, MD 02/24/14 1049

## 2014-07-14 ENCOUNTER — Encounter (HOSPITAL_COMMUNITY): Payer: Self-pay | Admitting: Emergency Medicine

## 2018-02-05 ENCOUNTER — Emergency Department (HOSPITAL_COMMUNITY)
Admission: EM | Admit: 2018-02-05 | Discharge: 2018-02-05 | Disposition: A | Discharge status: Home / Self Care | Payer: Self-pay | Attending: Emergency Medicine | Admitting: Emergency Medicine

## 2018-02-05 ENCOUNTER — Encounter (HOSPITAL_COMMUNITY): Payer: Self-pay | Admitting: Emergency Medicine

## 2018-02-05 ENCOUNTER — Other Ambulatory Visit: Payer: Self-pay

## 2018-02-05 DIAGNOSIS — N390 Urinary tract infection, site not specified: Secondary | ICD-10-CM | POA: Insufficient documentation

## 2018-02-05 LAB — URINALYSIS, ROUTINE W REFLEX MICROSCOPIC
BILIRUBIN URINE: NEGATIVE
GLUCOSE, UA: NEGATIVE mg/dL
Ketones, ur: NEGATIVE mg/dL
NITRITE: POSITIVE — AB
Protein, ur: 100 mg/dL — AB
SPECIFIC GRAVITY, URINE: 1.021 (ref 1.005–1.030)
WBC, UA: >50 WBC/hpf — ABNORMAL HIGH (ref 0–5)
pH: 5 (ref 5.0–8.0)

## 2018-02-05 LAB — PREGNANCY, URINE: Preg Test, Ur: NEGATIVE

## 2018-02-05 MED ORDER — NITROFURANTOIN MONOHYD MACRO 100 MG PO CAPS: 100.0000 mg | ORAL_CAPSULE | Freq: Two times a day (BID) | ORAL | 0 refills | Status: DC

## 2018-02-05 MED ORDER — CEPHALEXIN 500 MG PO CAPS: 500.0000 mg | ORAL_CAPSULE | Freq: Four times a day (QID) | ORAL | 0 refills | Status: DC

## 2018-02-05 MED ORDER — DIPHENHYDRAMINE HCL 25 MG PO CAPS
25.0000 mg | ORAL_CAPSULE | Freq: Once | ORAL | Status: AC
  Administered 2018-02-05: 25 mg via ORAL
  Filled 2018-02-05: qty 1

## 2018-02-05 MED ORDER — DIPHENHYDRAMINE HCL 25 MG PO TABS: 25.0000 mg | ORAL_TABLET | Freq: Four times a day (QID) | ORAL | 0 refills | Status: DC | PRN

## 2018-02-05 MED ORDER — PHENAZOPYRIDINE HCL 100 MG PO TABS
200.0000 mg | ORAL_TABLET | Freq: Once | ORAL | Status: AC
  Administered 2018-02-05: 200 mg via ORAL
  Filled 2018-02-05: qty 2

## 2018-02-05 MED ORDER — CEPHALEXIN 500 MG PO CAPS
500.0000 mg | ORAL_CAPSULE | Freq: Once | ORAL | Status: AC
  Administered 2018-02-05: 500 mg via ORAL
  Filled 2018-02-05: qty 1

## 2018-02-05 MED ORDER — PHENAZOPYRIDINE HCL 200 MG PO TABS: 200.0000 mg | ORAL_TABLET | Freq: Three times a day (TID) | ORAL | 0 refills | Status: DC

## 2018-02-05 NOTE — ED Triage Notes (Signed)
Patient complaining of burning with urination and blood in urine starting this morning. Also complaining of bilateral flank pain.
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
## 2018-02-05 NOTE — Discharge Instructions (Signed)
Drink plenty of water.  Take the antibiotic as directed until it is finished.  Be aware the Pyridium will turn your urine orange.  Return to the emergency room for any worsening symptoms such as fever, chills, increasing pain or vomiting.

## 2018-02-08 LAB — URINE CULTURE

## 2018-02-09 ENCOUNTER — Telehealth: Payer: Self-pay | Admitting: *Deleted

## 2018-02-09 NOTE — Telephone Encounter (Signed)
Post ED Visit - Positive Culture Follow-up: Unsuccessful Patient Follow-up  Culture assessed and recommendations reviewed by:  []  Gloria Reed, Pharm.D. []  Gloria Reed, Pharm.D., BCPS AQ-ID []  Gloria Reed, Pharm.D., BCPS []  Gloria Reed, Pharm.D., BCPS []  NorforkMinh Reed, [address] Rainbow BoulevardPharm.D., BCPS, AAHIVP []  Gloria Reed, Pharm.D., BCPS, AAHIVP []  Gloria Reed, PharmD []  Gloria Reed, PharmD, BCPS  Positive urine culture  []  Patient discharged without antimicrobial prescription and treatment is now indicated []  Organism is resistant to prescribed ED discharge antimicrobial []  Patient with positive blood cultures  Evaluated by Gloria HeckSamantha Petrucelli, PA-C who advises stop Nitrofurantoin on June 1.  Unable to contact patient after 3 attempts, letter will be sent to address on file  Gloria Reed, Gloria Reed 02/09/2018, 10:19 AM

## 2018-03-07 ENCOUNTER — Telehealth: Payer: Self-pay | Admitting: Emergency Medicine

## 2020-09-03 ENCOUNTER — Encounter (HOSPITAL_COMMUNITY): Payer: Self-pay | Admitting: Emergency Medicine

## 2020-09-03 ENCOUNTER — Other Ambulatory Visit: Payer: Self-pay

## 2020-09-03 ENCOUNTER — Emergency Department (HOSPITAL_COMMUNITY)
Admission: EM | Admit: 2020-09-03 | Discharge: 2020-09-03 | Disposition: A | Discharge status: Home / Self Care | Payer: Medicaid Other | Attending: Emergency Medicine | Admitting: Emergency Medicine

## 2020-09-03 DIAGNOSIS — Z20822 Contact with and (suspected) exposure to covid-19: Secondary | ICD-10-CM | POA: Insufficient documentation

## 2020-09-03 DIAGNOSIS — J101 Influenza due to other identified influenza virus with other respiratory manifestations: Secondary | ICD-10-CM

## 2020-09-03 DIAGNOSIS — J09X2 Influenza due to identified novel influenza A virus with other respiratory manifestations: Secondary | ICD-10-CM | POA: Insufficient documentation

## 2020-09-03 LAB — RESP PANEL BY RT-PCR (FLU A&B, COVID) ARPGX2
Influenza A by PCR: POSITIVE — AB
Influenza B by PCR: NEGATIVE
SARS Coronavirus 2 by RT PCR: NEGATIVE

## 2020-09-03 MED ORDER — ACETAMINOPHEN 325 MG PO TABS
650.0000 mg | ORAL_TABLET | Freq: Once | ORAL | Status: AC | PRN
Start: 2020-09-03 — End: 2020-09-03
  Administered 2020-09-03: 650 mg via ORAL
  Filled 2020-09-03: qty 2

## 2020-09-03 MED ORDER — OSELTAMIVIR PHOSPHATE 75 MG PO CAPS: 75.0000 mg | ORAL_CAPSULE | Freq: Two times a day (BID) | ORAL | 0 refills | Status: DC

## 2020-09-03 MED ORDER — ACETAMINOPHEN 500 MG PO TABS: 500.0000 mg | ORAL_TABLET | Freq: Four times a day (QID) | ORAL | 0 refills | Status: DC | PRN

## 2020-09-03 NOTE — Discharge Instructions (Addendum)
You have been diagnosed with influenza A.  Follow instruction below.  Your Covid test is negative.
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
## 2020-09-03 NOTE — ED Triage Notes (Signed)
Pt c/o of a fever with intermittent coughing since Tuesday.  Pt denies know exposure to covid.

## 2021-12-26 ENCOUNTER — Emergency Department (HOSPITAL_COMMUNITY): Payer: Self-pay

## 2021-12-26 ENCOUNTER — Encounter (HOSPITAL_COMMUNITY): Payer: Self-pay | Admitting: Emergency Medicine

## 2021-12-26 ENCOUNTER — Emergency Department (HOSPITAL_COMMUNITY)
Admission: EM | Admit: 2021-12-26 | Discharge: 2021-12-26 | Disposition: A | Discharge status: Home / Self Care | Payer: Self-pay | Attending: Emergency Medicine | Admitting: Emergency Medicine

## 2021-12-26 DIAGNOSIS — N390 Urinary tract infection, site not specified: Secondary | ICD-10-CM | POA: Insufficient documentation

## 2021-12-26 DIAGNOSIS — K8020 Calculus of gallbladder without cholecystitis without obstruction: Secondary | ICD-10-CM | POA: Insufficient documentation

## 2021-12-26 LAB — CBC WITH DIFFERENTIAL/PLATELET
Abs Immature Granulocytes: 0.03 10*3/uL (ref 0.00–0.07)
Basophils Absolute: 0 10*3/uL (ref 0.0–0.1)
Basophils Relative: 1 %
Eosinophils Absolute: 0.2 10*3/uL (ref 0.0–0.5)
Eosinophils Relative: 3 %
HCT: 38.4 % (ref 36.0–46.0)
Hemoglobin: 13.1 g/dL (ref 12.0–15.0)
Immature Granulocytes: 1 %
Lymphocytes Relative: 36 %
Lymphs Abs: 2.3 10*3/uL (ref 0.7–4.0)
MCH: 32.2 pg (ref 26.0–34.0)
MCHC: 34.1 g/dL (ref 30.0–36.0)
MCV: 94.3 fL (ref 80.0–100.0)
Monocytes Absolute: 0.5 10*3/uL (ref 0.1–1.0)
Monocytes Relative: 7 %
Neutro Abs: 3.4 10*3/uL (ref 1.7–7.7)
Neutrophils Relative %: 52 %
Platelets: 302 10*3/uL (ref 150–400)
RBC: 4.07 MIL/uL (ref 3.87–5.11)
RDW: 12.4 % (ref 11.5–15.5)
WBC: 6.5 10*3/uL (ref 4.0–10.5)
nRBC: 0 % (ref 0.0–0.2)

## 2021-12-26 LAB — COMPREHENSIVE METABOLIC PANEL
ALT: 12 U/L (ref 0–44)
AST: 17 U/L (ref 15–41)
Albumin: 4 g/dL (ref 3.5–5.0)
Alkaline Phosphatase: 69 U/L (ref 38–126)
Anion gap: 7 (ref 5–15)
BUN: 13 mg/dL (ref 6–20)
CO2: 26 mmol/L (ref 22–32)
Calcium: 9.3 mg/dL (ref 8.9–10.3)
Chloride: 108 mmol/L (ref 98–111)
Creatinine, Ser: 0.85 mg/dL (ref 0.44–1.00)
GFR, Estimated: >60 mL/min (ref >60)
Glucose, Bld: 104 mg/dL — ABNORMAL HIGH (ref 70–99)
Potassium: 3.6 mmol/L (ref 3.5–5.1)
Sodium: 141 mmol/L (ref 135–145)
Total Bilirubin: 0.4 mg/dL (ref 0.3–1.2)
Total Protein: 7.5 g/dL (ref 6.5–8.1)

## 2021-12-26 LAB — URINALYSIS, ROUTINE W REFLEX MICROSCOPIC
Bilirubin Urine: NEGATIVE
Glucose, UA: NEGATIVE mg/dL
Hgb urine dipstick: NEGATIVE
Ketones, ur: NEGATIVE mg/dL
Leukocytes,Ua: NEGATIVE
Nitrite: NEGATIVE
Protein, ur: NEGATIVE mg/dL
Specific Gravity, Urine: 1.01 (ref 1.005–1.030)
pH: 6 (ref 5.0–8.0)

## 2021-12-26 LAB — HCG, QUANTITATIVE, PREGNANCY: hCG, Beta Chain, Quant, S: <1 m[IU]/mL (ref <5)

## 2021-12-26 MED ORDER — ONDANSETRON HCL 4 MG/2ML IJ SOLN
4.0000 mg | Freq: Once | INTRAMUSCULAR | Status: AC
  Administered 2021-12-26: 4 mg via INTRAVENOUS
  Filled 2021-12-26: qty 2

## 2021-12-26 MED ORDER — FENTANYL CITRATE PF 50 MCG/ML IJ SOSY
50.0000 ug | PREFILLED_SYRINGE | Freq: Once | INTRAMUSCULAR | Status: AC
  Administered 2021-12-26: 50 ug via INTRAVENOUS
  Filled 2021-12-26: qty 1

## 2021-12-26 MED ORDER — LACTATED RINGERS IV BOLUS
1000.0000 mL | Freq: Once | INTRAVENOUS | Status: AC
  Administered 2021-12-26: 1000 mL via INTRAVENOUS

## 2021-12-26 NOTE — ED Notes (Signed)
Patient transported to CT 

## 2021-12-26 NOTE — ED Triage Notes (Signed)
Pt c/o bilateral flank pain on and off since Thursday. Pt states tonight pain started again about 1am and has gradually increased. Hx of kidney stones.  ?

## 2021-12-26 NOTE — ED Notes (Signed)
Pt returned from CT °

## 2021-12-26 NOTE — Discharge Instructions (Addendum)
You have been seen and discharged from the emergency department.  An ambulatory referral to GI has been placed.  They should contact you.  If you do not hear from them in the next week please call to schedule an appointment.  Follow-up with your primary provider for further evaluation and further care. Take home medications as prescribed. If you have any worsening symptoms, severe right upper quadrant abdominal pain, nausea/vomiting, high fevers or further concerns for your health please return to an emergency department for further evaluation. ?

## 2021-12-26 NOTE — ED Provider Notes (Signed)
?[facility] EMERGENCY DEPARTMENT ?Provider Note ? ? ?CSN: [id number] ?Arrival date & time: 12/26/21  0408 ? ?  ? ?History ? ?Chief Complaint  ?Patient presents with  ? Flank Pain  ? ? ?Gloria Reed is a 31 y.o. female. ? ?Patient with a history of kidney stones and history of pyelonephritis.  She started having some poly urea and flank pain Thursday.  This improved throughout the day and she was fine Friday but then Friday evening she started having recurrent symptoms.  She tried some medications that seem to make it better and did not.  It seemed to be worse on the right.  Her urine had cleared up at that time.  She has some nausea but no vomiting.  She states that she has a history of kidney stones but is been quite a while she has not remember if that is what this feels like.  No trauma.  No other associated symptoms. ? ? ?Flank Pain ?This is a recurrent problem. The current episode started yesterday. The problem occurs hourly. The problem has been gradually worsening.  ? ?  ? ?Home Medications ?Prior to Admission medications   ?Medication Sig Start Date End Date Taking? Authorizing Provider  ?acetaminophen (TYLENOL) 500 MG tablet Take 1 tablet (500 mg total) by mouth every 6 (six) hours as needed. 09/03/20   Fayrene Helper, PA-C  ?nitrofurantoin, macrocrystal-monohydrate, (MACROBID) 100 MG capsule Take 1 capsule (100 mg total) by mouth 2 (two) times daily. For 7 days 02/05/18   Pauline Aus, PA-C  ?oseltamivir (TAMIFLU) 75 MG capsule Take 1 capsule (75 mg total) by mouth every 12 (twelve) hours. 09/03/20   Fayrene Helper, PA-C  ?phenazopyridine (PYRIDIUM) 200 MG tablet Take 1 tablet (200 mg total) by mouth 3 (three) times daily. 02/05/18   Triplett, Babette Relic, PA-C  ?   ? ?Allergies    ?Amoxicillin, Penicillins, Sulfa antibiotics, and Ceclor [cefaclor]   ? ?Review of Systems   ?Review of Systems  ?Genitourinary:  Positive for flank pain.  ? ?Physical Exam ?Updated Vital Signs ?BP 114/78   Pulse 81   Temp 98.1 ?F  (36.7 ?C)   Resp 18   Ht 4\' 11"  (1.499 m)   Wt 71.2 kg   SpO2 98%   BMI 31.71 kg/m?  ?Physical Exam ?Vitals and nursing note reviewed.  ?Constitutional:   ?   Appearance: She is well-developed.  ?HENT:  ?   Head: Normocephalic and atraumatic.  ?   Mouth/Throat:  ?   Mouth: Mucous membranes are moist.  ?   Pharynx: Oropharynx is clear.  ?Eyes:  ?   Pupils: Pupils are equal, round, and reactive to light.  ?Cardiovascular:  ?   Rate and Rhythm: Normal rate and regular rhythm.  ?Pulmonary:  ?   Effort: No respiratory distress.  ?   Breath sounds: No stridor.  ?Abdominal:  ?   General: Abdomen is flat. There is no distension.  ?Musculoskeletal:     ?   General: Tenderness (right cva) present. No swelling. Normal range of motion.  ?   Cervical back: Normal range of motion.  ?Skin: ?   General: Skin is warm and dry.  ?Neurological:  ?   General: No focal deficit present.  ?   Mental Status: She is alert.  ? ? ?ED Results / Procedures / Treatments   ?Labs ?(all labs ordered are listed, but only abnormal results are displayed) ?Labs Reviewed  ?COMPREHENSIVE METABOLIC PANEL - Abnormal; Notable for the following components:  ?  Result Value  ? Glucose, Bld 104 (*)   ? All other components within normal limits  ?URINALYSIS, ROUTINE W REFLEX MICROSCOPIC - Abnormal; Notable for the following components:  ? APPearance HAZY (*)   ? All other components within normal limits  ?CBC WITH DIFFERENTIAL/PLATELET  ?HCG, QUANTITATIVE, PREGNANCY  ? ? ?EKG ?None ? ?Radiology ? ? ?Procedures ?Procedures  ? ? ?Medications Ordered in ED ?Medications  ?fentaNYL (SUBLIMAZE) injection 50 mcg (50 mcg Intravenous Given 12/26/21 0458)  ?lactated ringers bolus 1,000 mL (0 mLs Intravenous Stopped 12/26/21 0703)  ?ondansetron [facility]) injection 4 mg (4 mg Intravenous Given 12/26/21 0457)  ? ? ?ED Course/ Medical Decision Making/ A&P ?  ?                        ?Medical Decision Making ?Amount and/or Complexity of Data Reviewed ?Labs:  ordered. ?Radiology: ordered. ? ?Risk ?Prescription drug management. ? ? ?Eval for UTI vs kidney stone vs MSK causes. Pain meds provied. Fluids provided.  ? ?Care transferred pending completion of workup.  ?  ?Final Clinical Impression(s) / ED Diagnoses ?Final diagnoses:  ?Calculus of gallbladder without cholecystitis without obstruction  ? ? ?Rx / DC Orders ?ED Discharge Orders   ? ?      Ordered  ?  Ambulatory referral to Gastroenterology       ?Comments: Gallbladder neck stone  ? 12/26/21 1418  ? ?  ?  ? ?  ? ? ?  ?Marily Memos, MD ?12/27/21 [phone] ? ?

## 2021-12-26 NOTE — ED Provider Notes (Signed)
Patient signed out to me by previous provider. Please refer to their note for full HPI.  Briefly this is a 31 year old female who presented to the emergency department with bilateral flank pain.  History of kidney stones.  Blood work and urine are reassuring however we are pending CT renal study. ?Physical Exam  ?BP 109/80   Pulse 88   Temp 98.1 ?F (36.7 ?C)   Resp 18   Ht 4\' 11"  (1.499 m)   Wt 71.2 kg   SpO2 99%   BMI 31.71 kg/m?  ? ?Physical Exam ?Vitals and nursing note reviewed.  ?Constitutional:   ?   Appearance: Normal appearance.  ?HENT:  ?   Head: Normocephalic.  ?   Mouth/Throat:  ?   Mouth: Mucous membranes are moist.  ?Cardiovascular:  ?   Rate and Rhythm: Normal rate.  ?Pulmonary:  ?   Effort: Pulmonary effort is normal. No respiratory distress.  ?Abdominal:  ?   Palpations: Abdomen is soft.  ?   Tenderness: There is no abdominal tenderness.  ?Skin: ?   General: Skin is warm.  ?Neurological:  ?   Mental Status: She is alert and oriented to person, place, and time. Mental status is at baseline.  ?Psychiatric:     ?   Mood and Affect: Mood normal.  ? ? ?Procedures  ?Procedures ? ?ED Course / MDM  ?  ?Medical Decision Making ?Amount and/or Complexity of Data Reviewed ?Labs: ordered. ?Radiology: ordered. ? ?Risk ?Prescription drug management. ? ? ?CT renal stone identifies no kidney stones but does show gallstone with 1 possibly in the neck.  Recommended follow-up ultrasound.  On my evaluation abdomen is soft, no specific right upper quadrant tenderness, negative Murphy sign. ? ?Ultrasound shows gallbladder stone at the neck without any signs of obstruction/cholecystitis.  There is mild sludge.  Will place ambulatory referral to gastroenterology and plan for conservative treatment and outpatient follow-up with gastroenterology with strict return to ED precautions.  Patient at this time appears safe and stable for discharge and close outpatient follow up. Discharge plan and strict return to ED  precautions discussed, patient verbalizes understanding and agreement. ? ? ? ? ?  ? , DO ?12/26/21 1423 ? ?

## 2022-01-04 ENCOUNTER — Encounter: Payer: Self-pay | Admitting: Gastroenterology

## 2022-01-04 ENCOUNTER — Ambulatory Visit (INDEPENDENT_AMBULATORY_CARE_PROVIDER_SITE_OTHER): Payer: Self-pay | Admitting: Gastroenterology

## 2022-01-04 VITALS — BP 110/80 | HR 96 | Ht 59.0 in | Wt 157.4 lb

## 2022-01-04 DIAGNOSIS — Z800 Family history of malignant neoplasm of digestive organs: Secondary | ICD-10-CM

## 2022-01-04 DIAGNOSIS — K8020 Calculus of gallbladder without cholecystitis without obstruction: Secondary | ICD-10-CM

## 2022-01-04 NOTE — Patient Instructions (Signed)
If you are age 31 or older, your body mass index should be between 23-30. Your Body mass index is 31.79 kg/m?Marland Kitchen If this is out of the aforementioned range listed, please consider follow up with your Primary Care Provider. ? ?If you are age 1 or younger, your body mass index should be between 19-25. Your Body mass index is 31.79 kg/m?Marland Kitchen If this is out of the aformentioned range listed, please consider follow up with your Primary Care Provider.  ? ?We will send a referral to [facility] and they will contact you with an appointment. ? ?The [facility] providers would like to encourage you to use [facility] to communicate with providers for non-urgent requests or questions.  Due to long hold times on the telephone, sending your provider a message by [facility] may be a faster and more efficient way to get a response.  Please allow 48 business hours for a response.  Please remember that this is for non-urgent requests.  ? ?It was a pleasure to see you today! ? ?Thank you for trusting me with your gastrointestinal care!   ? ?Scott E.Tomasa Rand, MD  ? ?
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
## 2022-02-19 ENCOUNTER — Encounter (HOSPITAL_COMMUNITY): Payer: Self-pay

## 2022-02-19 ENCOUNTER — Emergency Department (HOSPITAL_COMMUNITY)
Admission: EM | Admit: 2022-02-19 | Discharge: 2022-02-19 | Disposition: A | Discharge status: Home / Self Care | Payer: Self-pay | Attending: Emergency Medicine | Admitting: Emergency Medicine

## 2022-02-19 ENCOUNTER — Emergency Department (HOSPITAL_COMMUNITY): Payer: Medicaid Other

## 2022-02-19 DIAGNOSIS — S40812A Abrasion of left upper arm, initial encounter: Secondary | ICD-10-CM | POA: Diagnosis not present

## 2022-02-19 DIAGNOSIS — S80811A Abrasion, right lower leg, initial encounter: Secondary | ICD-10-CM | POA: Diagnosis not present

## 2022-02-19 DIAGNOSIS — S0081XA Abrasion of other part of head, initial encounter: Secondary | ICD-10-CM | POA: Diagnosis not present

## 2022-02-19 DIAGNOSIS — S80812A Abrasion, left lower leg, initial encounter: Secondary | ICD-10-CM | POA: Insufficient documentation

## 2022-02-19 DIAGNOSIS — R000 Tachycardia, unspecified: Secondary | ICD-10-CM | POA: Insufficient documentation

## 2022-02-19 DIAGNOSIS — Z7982 Long term (current) use of aspirin: Secondary | ICD-10-CM | POA: Insufficient documentation

## 2022-02-19 DIAGNOSIS — S0990XA Unspecified injury of head, initial encounter: Secondary | ICD-10-CM | POA: Diagnosis present

## 2022-02-19 DIAGNOSIS — S40811A Abrasion of right upper arm, initial encounter: Secondary | ICD-10-CM | POA: Insufficient documentation

## 2022-02-19 DIAGNOSIS — Y92410 Unspecified street and highway as the place of occurrence of the external cause: Secondary | ICD-10-CM | POA: Diagnosis not present

## 2022-02-19 DIAGNOSIS — S0001XA Abrasion of scalp, initial encounter: Secondary | ICD-10-CM | POA: Diagnosis not present

## 2022-02-19 LAB — CBC
HCT: 38.8 % (ref 36.0–46.0)
Hemoglobin: 13 g/dL (ref 12.0–15.0)
MCH: 31.6 pg (ref 26.0–34.0)
MCHC: 33.5 g/dL (ref 30.0–36.0)
MCV: 94.4 fL (ref 80.0–100.0)
Platelets: 321 10*3/uL (ref 150–400)
RBC: 4.11 MIL/uL (ref 3.87–5.11)
RDW: 12.3 % (ref 11.5–15.5)
WBC: 10.9 10*3/uL — ABNORMAL HIGH (ref 4.0–10.5)
nRBC: 0 % (ref 0.0–0.2)

## 2022-02-19 LAB — I-STAT CHEM 8, ED
BUN: 8 mg/dL (ref 6–20)
Calcium, Ion: 1.12 mmol/L — ABNORMAL LOW (ref 1.15–1.40)
Chloride: 110 mmol/L (ref 98–111)
Creatinine, Ser: 1.1 mg/dL — ABNORMAL HIGH (ref 0.44–1.00)
Glucose, Bld: 124 mg/dL — ABNORMAL HIGH (ref 70–99)
HCT: 38 % (ref 36.0–46.0)
Hemoglobin: 12.9 g/dL (ref 12.0–15.0)
Potassium: 3.8 mmol/L (ref 3.5–5.1)
Sodium: 145 mmol/L (ref 135–145)
TCO2: 22 mmol/L (ref 22–32)

## 2022-02-19 LAB — COMPREHENSIVE METABOLIC PANEL
ALT: 15 U/L (ref 0–44)
AST: 25 U/L (ref 15–41)
Albumin: 4.2 g/dL (ref 3.5–5.0)
Alkaline Phosphatase: 69 U/L (ref 38–126)
Anion gap: 9 (ref 5–15)
BUN: 6 mg/dL (ref 6–20)
CO2: 21 mmol/L — ABNORMAL LOW (ref 22–32)
Calcium: 8.8 mg/dL — ABNORMAL LOW (ref 8.9–10.3)
Chloride: 111 mmol/L (ref 98–111)
Creatinine, Ser: 0.89 mg/dL (ref 0.44–1.00)
GFR, Estimated: >60 mL/min (ref >60)
Glucose, Bld: 126 mg/dL — ABNORMAL HIGH (ref 70–99)
Potassium: 3.6 mmol/L (ref 3.5–5.1)
Sodium: 141 mmol/L (ref 135–145)
Total Bilirubin: 0.4 mg/dL (ref 0.3–1.2)
Total Protein: 7.5 g/dL (ref 6.5–8.1)

## 2022-02-19 LAB — PROTIME-INR
INR: 0.9 (ref 0.8–1.2)
Prothrombin Time: 12.1 seconds (ref 11.4–15.2)

## 2022-02-19 LAB — SAMPLE TO BLOOD BANK

## 2022-02-19 LAB — ETHANOL: Alcohol, Ethyl (B): 194 mg/dL — ABNORMAL HIGH (ref <10)

## 2022-02-19 LAB — LACTIC ACID, PLASMA: Lactic Acid, Venous: 4.1 mmol/L (ref 0.5–1.9)

## 2022-02-19 MED ORDER — LACTATED RINGERS IV BOLUS
1000.0000 mL | Freq: Once | INTRAVENOUS | Status: AC
  Administered 2022-02-19: 1000 mL via INTRAVENOUS

## 2022-02-19 MED ORDER — IOHEXOL 300 MG/ML  SOLN
80.0000 mL | Freq: Once | INTRAMUSCULAR | Status: AC | PRN
  Administered 2022-02-19: 80 mL via INTRAVENOUS
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
## 2022-02-19 NOTE — ED Notes (Signed)
Lactic acid 4.1, Mesner MD notified.

## 2022-03-07 ENCOUNTER — Inpatient Hospital Stay (HOSPITAL_COMMUNITY)
Admission: EM | Admit: 2022-03-07 | Discharge: 2022-03-09 | DRG: 419 | Disposition: A | Discharge status: Home / Self Care | Payer: Self-pay | Attending: General Surgery | Admitting: General Surgery

## 2022-03-07 ENCOUNTER — Other Ambulatory Visit: Payer: Self-pay

## 2022-03-07 ENCOUNTER — Emergency Department (HOSPITAL_COMMUNITY): Payer: Self-pay

## 2022-03-07 DIAGNOSIS — Z6832 Body mass index (BMI) 32.0-32.9, adult: Secondary | ICD-10-CM

## 2022-03-07 DIAGNOSIS — D72829 Elevated white blood cell count, unspecified: Secondary | ICD-10-CM

## 2022-03-07 DIAGNOSIS — F32A Depression, unspecified: Secondary | ICD-10-CM | POA: Diagnosis present

## 2022-03-07 DIAGNOSIS — E669 Obesity, unspecified: Secondary | ICD-10-CM | POA: Diagnosis present

## 2022-03-07 DIAGNOSIS — Z28310 Unvaccinated for covid-19: Secondary | ICD-10-CM

## 2022-03-07 DIAGNOSIS — K828 Other specified diseases of gallbladder: Secondary | ICD-10-CM | POA: Diagnosis present

## 2022-03-07 DIAGNOSIS — Z800 Family history of malignant neoplasm of digestive organs: Secondary | ICD-10-CM

## 2022-03-07 DIAGNOSIS — F419 Anxiety disorder, unspecified: Secondary | ICD-10-CM | POA: Diagnosis present

## 2022-03-07 DIAGNOSIS — K8020 Calculus of gallbladder without cholecystitis without obstruction: Secondary | ICD-10-CM

## 2022-03-07 DIAGNOSIS — K8000 Calculus of gallbladder with acute cholecystitis without obstruction: Principal | ICD-10-CM | POA: Diagnosis present

## 2022-03-07 DIAGNOSIS — R109 Unspecified abdominal pain: Secondary | ICD-10-CM

## 2022-03-07 LAB — COMPREHENSIVE METABOLIC PANEL
ALT: 13 U/L (ref 0–44)
AST: 15 U/L (ref 15–41)
Albumin: 3.9 g/dL (ref 3.5–5.0)
Alkaline Phosphatase: 70 U/L (ref 38–126)
Anion gap: 9 (ref 5–15)
BUN: 11 mg/dL (ref 6–20)
CO2: 24 mmol/L (ref 22–32)
Calcium: 8.8 mg/dL — ABNORMAL LOW (ref 8.9–10.3)
Chloride: 105 mmol/L (ref 98–111)
Creatinine, Ser: 1.07 mg/dL — ABNORMAL HIGH (ref 0.44–1.00)
GFR, Estimated: >60 mL/min (ref >60)
Glucose, Bld: 108 mg/dL — ABNORMAL HIGH (ref 70–99)
Potassium: 3.6 mmol/L (ref 3.5–5.1)
Sodium: 138 mmol/L (ref 135–145)
Total Bilirubin: 0.7 mg/dL (ref 0.3–1.2)
Total Protein: 7.5 g/dL (ref 6.5–8.1)

## 2022-03-07 LAB — URINALYSIS, ROUTINE W REFLEX MICROSCOPIC
Bilirubin Urine: NEGATIVE
Glucose, UA: NEGATIVE mg/dL
Hgb urine dipstick: NEGATIVE
Ketones, ur: 20 mg/dL — AB
Leukocytes,Ua: NEGATIVE
Nitrite: NEGATIVE
Protein, ur: NEGATIVE mg/dL
Specific Gravity, Urine: 1.02 (ref 1.005–1.030)
pH: 6 (ref 5.0–8.0)

## 2022-03-07 LAB — CBC
HCT: 36.4 % (ref 36.0–46.0)
Hemoglobin: 12.4 g/dL (ref 12.0–15.0)
MCH: 32.4 pg (ref 26.0–34.0)
MCHC: 34.1 g/dL (ref 30.0–36.0)
MCV: 95 fL (ref 80.0–100.0)
Platelets: 303 10*3/uL (ref 150–400)
RBC: 3.83 MIL/uL — ABNORMAL LOW (ref 3.87–5.11)
RDW: 12.3 % (ref 11.5–15.5)
WBC: 11.8 10*3/uL — ABNORMAL HIGH (ref 4.0–10.5)
nRBC: 0 % (ref 0.0–0.2)

## 2022-03-07 LAB — POC URINE PREG, ED: Preg Test, Ur: NEGATIVE

## 2022-03-07 LAB — LIPASE, BLOOD: Lipase: 32 U/L (ref 11–51)

## 2022-03-07 MED ORDER — FENTANYL CITRATE PF 50 MCG/ML IJ SOSY
50.0000 ug | PREFILLED_SYRINGE | Freq: Once | INTRAMUSCULAR | Status: AC
  Administered 2022-03-07: 50 ug via INTRAVENOUS
  Filled 2022-03-07: qty 1

## 2022-03-07 MED ORDER — SODIUM CHLORIDE 0.9 % IV BOLUS
1000.0000 mL | Freq: Once | INTRAVENOUS | Status: AC
  Administered 2022-03-07: 1000 mL via INTRAVENOUS

## 2022-03-07 MED ORDER — HYDROMORPHONE HCL 1 MG/ML IJ SOLN
1.0000 mg | Freq: Once | INTRAMUSCULAR | Status: AC
  Administered 2022-03-07: 1 mg via INTRAVENOUS
  Filled 2022-03-07: qty 1

## 2022-03-07 MED ORDER — ACETAMINOPHEN 650 MG RE SUPP: 650.0000 mg | Freq: Four times a day (QID) | RECTAL | Status: DC | PRN

## 2022-03-07 MED ORDER — OXYCODONE HCL 5 MG PO TABS
5.0000 mg | ORAL_TABLET | ORAL | Status: DC | PRN
  Administered 2022-03-08 – 2022-03-09 (×2): 5 mg via ORAL
  Filled 2022-03-07 (×2): qty 1

## 2022-03-07 MED ORDER — ONDANSETRON HCL 4 MG/2ML IJ SOLN
4.0000 mg | Freq: Once | INTRAMUSCULAR | Status: AC
  Administered 2022-03-07: 4 mg via INTRAVENOUS
  Filled 2022-03-07: qty 2

## 2022-03-07 MED ORDER — ACETAMINOPHEN 325 MG PO TABS
650.0000 mg | ORAL_TABLET | Freq: Four times a day (QID) | ORAL | Status: DC | PRN
  Administered 2022-03-09: 650 mg via ORAL
  Filled 2022-03-07: qty 2

## 2022-03-07 MED ORDER — ONDANSETRON HCL 4 MG/2ML IJ SOLN
4.0000 mg | Freq: Four times a day (QID) | INTRAMUSCULAR | Status: DC | PRN
  Administered 2022-03-08: 4 mg via INTRAVENOUS
  Filled 2022-03-07: qty 2

## 2022-03-07 MED ORDER — SODIUM CHLORIDE 0.9 % IV SOLN: INTRAVENOUS | Status: DC

## 2022-03-07 MED ORDER — MORPHINE SULFATE (PF) 2 MG/ML IV SOLN
2.0000 mg | INTRAVENOUS | Status: DC | PRN
  Administered 2022-03-08 (×4): 2 mg via INTRAVENOUS
  Filled 2022-03-07 (×4): qty 1

## 2022-03-07 MED ORDER — ONDANSETRON HCL 4 MG PO TABS: 4.0000 mg | ORAL_TABLET | Freq: Four times a day (QID) | ORAL | Status: DC | PRN

## 2022-03-07 NOTE — ED Provider Notes (Signed)
[facility] EMERGENCY DEPARTMENT Provider Note   CSN: [id number] Arrival date & time: 03/07/22  1849     History  Chief Complaint  Patient presents with   Abdominal Pain   Back Pain    Gloria Reed is a 31 y.o. female who presents emergency department complaining of abdominal pain.  Patient states that she started having worsening pain in her right abdomen around 5 AM this morning, then around 1 PM she had pain radiating across her back.  Associated nausea, no vomiting.  States that she was diagnosed with gallstones a couple months ago.  States this pain feels similar, but worse.  No urinary symptoms.  No fever.   Abdominal Pain Associated symptoms: nausea   Associated symptoms: no constipation, no diarrhea, no dysuria, no fever, no hematuria and no vomiting   Back Pain Associated symptoms: abdominal pain   Associated symptoms: no dysuria and no fever        Home Medications Prior to Admission medications   Medication Sig Start Date End Date Taking? Authorizing Provider  aspirin-acetaminophen-caffeine (EXCEDRIN MIGRAINE) [phone] MG tablet Take 2 tablets by mouth every 6 (six) hours as needed for headache or migraine.    [provider]  ciprofloxacin (CIPRO) 500 MG tablet Take 500 mg by mouth See admin instructions. Bid x 5 days 02/15/22   [provider]  Phenazopyridine HCl (URINARY PAIN RELIEF PO) Take 2 tablets by mouth 3 (three) times daily as needed (urinary pain).    [provider]      Allergies    Amoxicillin, Penicillins, Sulfa antibiotics, and Ceclor [cefaclor]    Review of Systems   Review of Systems  Constitutional:  Negative for fever.  Gastrointestinal:  Positive for abdominal pain and nausea. Negative for constipation, diarrhea and vomiting.  Genitourinary:  Negative for dysuria, frequency and hematuria.  Musculoskeletal:  Positive for back pain.  All other systems reviewed and are negative.   Physical Exam Updated  Vital Signs BP 128/86   Pulse 89   Temp 98.4 F (36.9 C) (Oral)   Resp 18   Ht 4\' 11"  (1.499 m)   Wt 70.3 kg   SpO2 100%   BMI 31.31 kg/m  Physical Exam Vitals and nursing note reviewed.  Constitutional:      Appearance: Normal appearance.     Comments: Appears uncomfortable  HENT:     Head: Normocephalic and atraumatic.  Eyes:     Conjunctiva/sclera: Conjunctivae normal.  Cardiovascular:     Rate and Rhythm: Normal rate and regular rhythm.  Pulmonary:     Effort: Pulmonary effort is normal. No respiratory distress.     Breath sounds: Normal breath sounds.  Abdominal:     General: There is no distension.     Palpations: Abdomen is soft.     Tenderness: There is generalized abdominal tenderness. There is no right CVA tenderness, left CVA tenderness, guarding or rebound.  Skin:    General: Skin is warm and dry.  Neurological:     General: No focal deficit present.     Mental Status: She is alert.     ED Results / Procedures / Treatments   Labs (all labs ordered are listed, but only abnormal results are displayed) Labs Reviewed  COMPREHENSIVE METABOLIC PANEL - Abnormal; Notable for the following components:      Result Value   Glucose, Bld 108 (*)    Creatinine, Ser 1.07 (*)    Calcium 8.8 (*)    All other  components within normal limits  CBC - Abnormal; Notable for the following components:   WBC 11.8 (*)    RBC 3.83 (*)    All other components within normal limits  URINALYSIS, ROUTINE W REFLEX MICROSCOPIC - Abnormal; Notable for the following components:   APPearance HAZY (*)    Ketones, ur 20 (*)    All other components within normal limits  LIPASE, BLOOD  POC URINE PREG, ED    EKG None  Radiology CT ABDOMEN PELVIS WO CONTRAST  Result Date: 03/07/2022 CLINICAL DATA:  Abdomen pain EXAM: CT ABDOMEN AND PELVIS WITHOUT CONTRAST TECHNIQUE: Multidetector CT imaging of the abdomen and pelvis was performed following the standard protocol without IV contrast.  RADIATION DOSE REDUCTION: This exam was performed according to the departmental dose-optimization program which includes automated exposure control, adjustment of the mA and/or kV according to patient size and/or use of iterative reconstruction technique. COMPARISON:  CT 02/19/2022, 12/26/2021 FINDINGS: Lower chest: Lung bases demonstrate no acute airspace disease. Hepatobiliary: 2.2 cm gallstone at the gallbladder neck with some gallbladder distension but no convincing inflammation. No biliary dilatation. Pancreas: Unremarkable. No pancreatic ductal dilatation or surrounding inflammatory changes. Spleen: Normal in size without focal abnormality. Adrenals/Urinary Tract: Adrenal glands are normal. No hydronephrosis. Small nonobstructing stone in the right kidney. Previously noted right UVJ calculus is no longer evident. The bladder is normal Stomach/Bowel: Stomach is within normal limits. Appendix appears normal. No evidence of bowel wall thickening, distention, or inflammatory changes. Vascular/Lymphatic: No significant vascular findings are present. No enlarged abdominal or pelvic lymph nodes. Reproductive: Uterus and bilateral adnexa are unremarkable. Other: Negative for pelvic effusion or free air. Musculoskeletal: Bilateral SI joint cortical screws are grossly stable in appearance. No acute osseous abnormality IMPRESSION: 1. 2.2 cm gallstone at the gallbladder neck with some gallbladder distension but no definite inflammatory changes by CT. Follow-up ultrasound may be obtained if symptoms are referable to the gallbladder 2. Nonobstructing right kidney stone. Previously noted right UVJ stone has resolved. Electronically Signed   By: Jasmine Pang M.D.   On: 03/07/2022 22:11    Procedures Procedures    Medications Ordered in ED Medications  HYDROmorphone (DILAUDID) injection 1 mg (has no administration in time range)  fentaNYL (SUBLIMAZE) injection 50 mcg (50 mcg Intravenous Given 03/07/22 2036)   ondansetron (ZOFRAN) injection 4 mg (4 mg Intravenous Given 03/07/22 2036)  sodium chloride 0.9 % bolus 1,000 mL (0 mLs Intravenous Stopped 03/07/22 2207)  fentaNYL (SUBLIMAZE) injection 50 mcg (50 mcg Intravenous Given 03/07/22 2133)    ED Course/ Medical Decision Making/ A&P                           Medical Decision Making Amount and/or Complexity of Data Reviewed Labs: ordered. Radiology: ordered.  Risk Prescription drug management.  This patient is a 31 y.o. female who presents to the ED for concern of abdominal pain, this involves an extensive number of treatment options, and is a complaint that carries with it a high risk of complications and morbidity. The emergent differential diagnosis prior to evaluation includes, but is not limited to,  AAA, mesenteric ischemia, appendicitis, diverticulitis, DKA, gastritis/gastroenteritis, nephrolithiasis, pancreatitis, constipation, UTI, bowel obstruction, biliary disease, IBD, PUD, hepatitis, ectopic pregnancy, ovarian torsion, PID. This is not an exhaustive differential.   Past Medical History / Co-morbidities / Social History: Depression, anxiety, kidney stones, gallstones  Additional history: Chart reviewed. Pertinent results include: Was seen on 4/16 for similar symptoms.  At that time was diagnosed with a gallstone, without signs of obstruction or cholecystitis.  Physical Exam: Physical exam performed. The pertinent findings include: Normal vital signs.  Generalized abdominal pain with some tenderness, no guarding.  Lab Tests: I ordered, and personally interpreted labs.  The pertinent results include:  Mild leukocytosis of 11.8. Electrolytes grossly within normal limits. Urinalysis negative for hematuria or infection.    Imaging Studies: I ordered imaging studies including CT abdomen pelvis. I independently visualized and interpreted imaging which showed 2.2 cm gallstone at gallbladder neck with some gallbladder distention. I agree  with the radiologist interpretation. Ultrasound not available at this location at this time.    Medications: I ordered medication including IV fluids, analgesics and antiemetics  for abdominal pain. Reevaluation of the patient after these medicines showed that the patient stayed the same. I have reviewed the patients home medicines and have made adjustments as needed.  Consultations Obtained: I requested consultation with the general surgeon Dr Henreitta Leber,  and discussed lab and imaging findings as well as pertinent plan - they recommend: medical admission and surgical consultation for cholecystectomy tomorrow.  I consulted hospitalist Dr Carren Rang who will admit   Disposition: After consideration of the diagnostic results and the patients response to treatment, I feel that patient is requiring medical admission for pain control until surgery tomorrow.   I discussed this case with my attending physician Dr. Hyacinth Meeker who cosigned this note including patient's presenting symptoms, physical exam, and planned diagnostics and interventions. Attending physician stated agreement with plan or made changes to plan which were implemented.     Final Clinical Impression(s) / ED Diagnoses Final diagnoses:  Calculus of gallbladder without cholecystitis without obstruction    Rx / DC Orders ED Discharge Orders     None      Portions of this report may have been transcribed using voice recognition software. Every effort was made to ensure accuracy; however, inadvertent computerized transcription errors may be present.    Jeanella Flattery 03/07/22 2312    Eber Hong, MD 03/09/22 1118

## 2022-03-08 ENCOUNTER — Other Ambulatory Visit: Payer: Self-pay

## 2022-03-08 ENCOUNTER — Inpatient Hospital Stay (HOSPITAL_COMMUNITY): Payer: Self-pay | Admitting: Anesthesiology

## 2022-03-08 ENCOUNTER — Encounter (HOSPITAL_COMMUNITY): Payer: Self-pay | Admitting: Family Medicine

## 2022-03-08 ENCOUNTER — Encounter (HOSPITAL_COMMUNITY)
Admission: EM | Disposition: A | Discharge status: Home / Self Care | Payer: Self-pay | Source: Home / Self Care | Attending: General Surgery

## 2022-03-08 DIAGNOSIS — K8000 Calculus of gallbladder with acute cholecystitis without obstruction: Principal | ICD-10-CM

## 2022-03-08 DIAGNOSIS — D72829 Elevated white blood cell count, unspecified: Secondary | ICD-10-CM

## 2022-03-08 DIAGNOSIS — K8020 Calculus of gallbladder without cholecystitis without obstruction: Secondary | ICD-10-CM

## 2022-03-08 DIAGNOSIS — R1010 Upper abdominal pain, unspecified: Secondary | ICD-10-CM

## 2022-03-08 DIAGNOSIS — R109 Unspecified abdominal pain: Secondary | ICD-10-CM

## 2022-03-08 HISTORY — PX: CHOLECYSTECTOMY: SHX55

## 2022-03-08 LAB — COMPREHENSIVE METABOLIC PANEL
ALT: 10 U/L (ref 0–44)
AST: 11 U/L — ABNORMAL LOW (ref 15–41)
Albumin: 3.3 g/dL — ABNORMAL LOW (ref 3.5–5.0)
Alkaline Phosphatase: 59 U/L (ref 38–126)
Anion gap: 6 (ref 5–15)
BUN: 10 mg/dL (ref 6–20)
CO2: 24 mmol/L (ref 22–32)
Calcium: 8.3 mg/dL — ABNORMAL LOW (ref 8.9–10.3)
Chloride: 109 mmol/L (ref 98–111)
Creatinine, Ser: 0.85 mg/dL (ref 0.44–1.00)
GFR, Estimated: >60 mL/min (ref >60)
Glucose, Bld: 103 mg/dL — ABNORMAL HIGH (ref 70–99)
Potassium: 3.6 mmol/L (ref 3.5–5.1)
Sodium: 139 mmol/L (ref 135–145)
Total Bilirubin: 0.5 mg/dL (ref 0.3–1.2)
Total Protein: 6.2 g/dL — ABNORMAL LOW (ref 6.5–8.1)

## 2022-03-08 LAB — CBC WITH DIFFERENTIAL/PLATELET
Abs Immature Granulocytes: 0.03 10*3/uL (ref 0.00–0.07)
Basophils Absolute: 0 10*3/uL (ref 0.0–0.1)
Basophils Relative: 0 %
Eosinophils Absolute: 0.1 10*3/uL (ref 0.0–0.5)
Eosinophils Relative: 1 %
HCT: 32.5 % — ABNORMAL LOW (ref 36.0–46.0)
Hemoglobin: 10.9 g/dL — ABNORMAL LOW (ref 12.0–15.0)
Immature Granulocytes: 0 %
Lymphocytes Relative: 29 %
Lymphs Abs: 2.5 10*3/uL (ref 0.7–4.0)
MCH: 32.3 pg (ref 26.0–34.0)
MCHC: 33.5 g/dL (ref 30.0–36.0)
MCV: 96.4 fL (ref 80.0–100.0)
Monocytes Absolute: 0.6 10*3/uL (ref 0.1–1.0)
Monocytes Relative: 7 %
Neutro Abs: 5.6 10*3/uL (ref 1.7–7.7)
Neutrophils Relative %: 63 %
Platelets: 265 10*3/uL (ref 150–400)
RBC: 3.37 MIL/uL — ABNORMAL LOW (ref 3.87–5.11)
RDW: 12.4 % (ref 11.5–15.5)
WBC: 8.9 10*3/uL (ref 4.0–10.5)
nRBC: 0 % (ref 0.0–0.2)

## 2022-03-08 LAB — MAGNESIUM: Magnesium: 2 mg/dL (ref 1.7–2.4)

## 2022-03-08 LAB — HIV ANTIBODY (ROUTINE TESTING W REFLEX): HIV Screen 4th Generation wRfx: NONREACTIVE

## 2022-03-08 SURGERY — LAPAROSCOPIC CHOLECYSTECTOMY
Anesthesia: General | Site: Abdomen

## 2022-03-08 MED ORDER — ESMOLOL HCL 100 MG/10ML IV SOLN
INTRAVENOUS | Status: DC | PRN
  Administered 2022-03-08: 20 mg via INTRAVENOUS
  Administered 2022-03-08: 10 mg via INTRAVENOUS

## 2022-03-08 MED ORDER — DEXAMETHASONE SODIUM PHOSPHATE 10 MG/ML IJ SOLN
INTRAMUSCULAR | Status: DC | PRN
  Administered 2022-03-08: 10 mg via INTRAVENOUS

## 2022-03-08 MED ORDER — DEXMEDETOMIDINE HCL IN NACL 200 MCG/50ML IV SOLN
INTRAVENOUS | Status: DC | PRN
  Administered 2022-03-08 (×2): 8 ug via INTRAVENOUS
  Administered 2022-03-08: 4 ug via INTRAVENOUS

## 2022-03-08 MED ORDER — FENTANYL CITRATE PF 50 MCG/ML IJ SOSY
25.0000 ug | PREFILLED_SYRINGE | INTRAMUSCULAR | Status: DC | PRN
  Administered 2022-03-08 (×2): 50 ug via INTRAVENOUS
  Filled 2022-03-08 (×3): qty 1

## 2022-03-08 MED ORDER — LIDOCAINE 2% (20 MG/ML) 5 ML SYRINGE
INTRAMUSCULAR | Status: DC | PRN
  Administered 2022-03-08: 60 mg via INTRAVENOUS

## 2022-03-08 MED ORDER — CIPROFLOXACIN IN D5W 400 MG/200ML IV SOLN
INTRAVENOUS | Status: AC
  Filled 2022-03-08: qty 200

## 2022-03-08 MED ORDER — ONDANSETRON HCL 4 MG/2ML IJ SOLN
INTRAMUSCULAR | Status: AC
  Filled 2022-03-08: qty 2

## 2022-03-08 MED ORDER — SUGAMMADEX SODIUM 200 MG/2ML IV SOLN
INTRAVENOUS | Status: DC | PRN
  Administered 2022-03-08: 200 mg via INTRAVENOUS

## 2022-03-08 MED ORDER — DEXMEDETOMIDINE HCL IN NACL 80 MCG/20ML IV SOLN
INTRAVENOUS | Status: AC
  Filled 2022-03-08: qty 20

## 2022-03-08 MED ORDER — ONDANSETRON HCL 4 MG PO TABS: 4.0000 mg | ORAL_TABLET | Freq: Four times a day (QID) | ORAL | 0 refills | Status: AC | PRN

## 2022-03-08 MED ORDER — PROPOFOL 10 MG/ML IV BOLUS
INTRAVENOUS | Status: DC | PRN
  Administered 2022-03-08: 150 mg via INTRAVENOUS

## 2022-03-08 MED ORDER — LACTATED RINGERS IV SOLN: INTRAVENOUS | Status: DC | PRN

## 2022-03-08 MED ORDER — ONDANSETRON HCL 4 MG/2ML IJ SOLN: 4.0000 mg | Freq: Once | INTRAMUSCULAR | Status: DC | PRN

## 2022-03-08 MED ORDER — ROCURONIUM BROMIDE 10 MG/ML (PF) SYRINGE
PREFILLED_SYRINGE | INTRAVENOUS | Status: DC | PRN
  Administered 2022-03-08: 50 mg via INTRAVENOUS

## 2022-03-08 MED ORDER — LIDOCAINE HCL (PF) 2 % IJ SOLN
INTRAMUSCULAR | Status: AC
  Filled 2022-03-08: qty 5

## 2022-03-08 MED ORDER — CIPROFLOXACIN IN D5W 400 MG/200ML IV SOLN: 400.0000 mg | INTRAVENOUS | Status: DC

## 2022-03-08 MED ORDER — BUPIVACAINE HCL (PF) 0.5 % IJ SOLN
INTRAMUSCULAR | Status: AC
  Filled 2022-03-08: qty 30

## 2022-03-08 MED ORDER — CHLORHEXIDINE GLUCONATE CLOTH 2 % EX PADS: 6.0000 | MEDICATED_PAD | Freq: Once | CUTANEOUS | Status: DC

## 2022-03-08 MED ORDER — MIDAZOLAM HCL 5 MG/5ML IJ SOLN
INTRAMUSCULAR | Status: DC | PRN
  Administered 2022-03-08: 2 mg via INTRAVENOUS

## 2022-03-08 MED ORDER — ROCURONIUM BROMIDE 10 MG/ML (PF) SYRINGE
PREFILLED_SYRINGE | INTRAVENOUS | Status: AC
  Filled 2022-03-08: qty 10

## 2022-03-08 MED ORDER — ONDANSETRON HCL 4 MG/2ML IJ SOLN
INTRAMUSCULAR | Status: DC | PRN
  Administered 2022-03-08: 4 mg via INTRAVENOUS

## 2022-03-08 MED ORDER — SODIUM CHLORIDE 0.9 % IR SOLN
Status: DC | PRN
  Administered 2022-03-08: 1000 mL

## 2022-03-08 MED ORDER — PROPOFOL 10 MG/ML IV BOLUS
INTRAVENOUS | Status: AC
  Filled 2022-03-08: qty 20

## 2022-03-08 MED ORDER — OXYCODONE HCL 5 MG PO TABS
5.0000 mg | ORAL_TABLET | ORAL | 0 refills | Status: AC | PRN
Start: 2022-03-08 — End: ?

## 2022-03-08 MED ORDER — FENTANYL CITRATE (PF) 250 MCG/5ML IJ SOLN
INTRAMUSCULAR | Status: AC
  Filled 2022-03-08: qty 5

## 2022-03-08 MED ORDER — ESMOLOL HCL 100 MG/10ML IV SOLN
INTRAVENOUS | Status: AC
  Filled 2022-03-08: qty 10

## 2022-03-08 MED ORDER — MIDAZOLAM HCL 2 MG/2ML IJ SOLN
INTRAMUSCULAR | Status: AC
  Filled 2022-03-08: qty 2

## 2022-03-08 MED ORDER — FENTANYL CITRATE (PF) 250 MCG/5ML IJ SOLN
INTRAMUSCULAR | Status: DC | PRN
  Administered 2022-03-08 (×3): 50 ug via INTRAVENOUS
  Administered 2022-03-08: 100 ug via INTRAVENOUS

## 2022-03-08 MED ORDER — HEMOSTATIC AGENTS (NO CHARGE) OPTIME
TOPICAL | Status: DC | PRN
  Administered 2022-03-08 (×2): 1 via TOPICAL

## 2022-03-08 MED ORDER — BUPIVACAINE HCL (PF) 0.5 % IJ SOLN
INTRAMUSCULAR | Status: DC | PRN
  Administered 2022-03-08: 20 mL

## 2022-03-08 SURGICAL SUPPLY — 44 items
APPLIER CLIP ROT 10 11.4 M/L (STAPLE) ×2
BLADE SURG 15 STRL LF DISP TIS (BLADE) ×1 IMPLANT
BLADE SURG 15 STRL SS (BLADE) ×1
CHLORAPREP W/TINT 26 (MISCELLANEOUS) ×2 IMPLANT
CLIP APPLIE ROT 10 11.4 M/L (STAPLE) ×1 IMPLANT
CLOTH BEACON ORANGE TIMEOUT ST (SAFETY) ×2 IMPLANT
COVER LIGHT HANDLE STERIS (MISCELLANEOUS) ×4 IMPLANT
DECANTER SPIKE VIAL GLASS SM (MISCELLANEOUS) ×2 IMPLANT
DERMABOND ADVANCED (GAUZE/BANDAGES/DRESSINGS) ×1
DERMABOND ADVANCED .7 DNX12 (GAUZE/BANDAGES/DRESSINGS) ×1 IMPLANT
ELECT REM PT RETURN 9FT ADLT (ELECTROSURGICAL) ×2
ELECTRODE REM PT RTRN 9FT ADLT (ELECTROSURGICAL) ×1 IMPLANT
GLOVE BIO SURGEON STRL SZ 6.5 (GLOVE) ×3 IMPLANT
GLOVE BIOGEL PI IND STRL 6.5 (GLOVE) ×1 IMPLANT
GLOVE BIOGEL PI IND STRL 7.0 (GLOVE) ×2 IMPLANT
GLOVE BIOGEL PI INDICATOR 6.5 (GLOVE) ×1
GLOVE BIOGEL PI INDICATOR 7.0 (GLOVE) ×2
GLOVE ECLIPSE 6.5 STRL STRAW (GLOVE) ×1 IMPLANT
GLOVE SS BIOGEL STRL SZ 6.5 (GLOVE) IMPLANT
GLOVE SUPERSENSE BIOGEL SZ 6.5 (GLOVE) ×1
GOWN STRL REUS W/TWL LRG LVL3 (GOWN DISPOSABLE) ×7 IMPLANT
HEMOSTAT SNOW SURGICEL 2X4 (HEMOSTASIS) ×2 IMPLANT
INST SET LAPROSCOPIC AP (KITS) ×2 IMPLANT
KIT TURNOVER KIT A (KITS) ×2 IMPLANT
MANIFOLD NEPTUNE II (INSTRUMENTS) ×2 IMPLANT
NDL INSUFFLATION 14GA 120MM (NEEDLE) ×1 IMPLANT
NEEDLE INSUFFLATION 14GA 120MM (NEEDLE) ×2 IMPLANT
NS IRRIG 1000ML POUR BTL (IV SOLUTION) ×2 IMPLANT
PACK LAP CHOLE LZT030E (CUSTOM PROCEDURE TRAY) ×2 IMPLANT
PAD ARMBOARD 7.5X6 YLW CONV (MISCELLANEOUS) ×2 IMPLANT
POWDER SURGICEL 3.0 GRAM (HEMOSTASIS) ×1 IMPLANT
SET BASIN LINEN APH (SET/KITS/TRAYS/PACK) ×2 IMPLANT
SET TUBE SMOKE EVAC HIGH FLOW (TUBING) ×2 IMPLANT
SLEEVE Z-THREAD 5X100MM (TROCAR) ×2 IMPLANT
SUT MNCRL AB 4-0 PS2 18 (SUTURE) ×4 IMPLANT
SUT VICRYL 0 UR6 27IN ABS (SUTURE) ×2 IMPLANT
SYS BAG RETRIEVAL 10MM (BASKET) ×2
SYSTEM BAG RETRIEVAL 10MM (BASKET) ×1 IMPLANT
TIP ENDOSCOPIC SURGICEL (TIP) ×1 IMPLANT
TROCAR Z-THRD FIOS HNDL 11X100 (TROCAR) ×2 IMPLANT
TROCAR Z-THREAD FIOS 5X100MM (TROCAR) ×2 IMPLANT
TROCAR Z-THREAD SLEEVE 11X100 (TROCAR) ×2 IMPLANT
TUBE CONNECTING 12X1/4 (SUCTIONS) ×2 IMPLANT
WARMER LAPAROSCOPE (MISCELLANEOUS) ×2 IMPLANT

## 2022-03-08 NOTE — Progress Notes (Signed)
Decided against regular supper tray and instead ate jello and felt a little nauseated.  Has received zofran and morphine.  Drank some chicken broth, as well. Has been walking to bathroom and is now walking in hallway .

## 2022-03-08 NOTE — Discharge Summary (Signed)
Physician Discharge Summary  Patient ID: Gloria Reed MRN: [id number] DOB/AGE: May 02, 1991 31 y.o.  Admit date: 03/07/2022 Discharge date: 03/09/2022  Admission Diagnoses: Cholelithiasis   Discharge Diagnoses:  Principal Problem:   Calculus of gallbladder with acute cholecystitis Active Problems:   Abdominal pain   Leukocytosis   Discharged Condition: good  Hospital Course: Gloria Reed is a 31 yo with gallstone that had intractable pain and nausea and required admission for cholecystectomy. She was taken to the OR on 6/27 and had her cholecystectomy and showed acute cholecystitis. She stayed overnight due to pain and nausea and is doing better this AM. Patient has ambulated and is eating a diet.   Consults: None  Significant Diagnostic Studies: CT with gallstone at the neck 2cm  Treatments: IV hydration, Surgery- Laparoscopic cholecystectomy 6/27   Discharge Exam: Blood pressure 114/75, pulse 99, temperature 98.1 F (36.7 C), resp. rate 18, height 4\' 11"  (1.499 m), weight 74.1 kg, SpO2 98 %. General appearance: alert and no distress Resp: normal work breathing GI: soft, mildly distended, epigastric tender appropriate, port sites c/d/I with dermabond   Disposition: Discharge disposition: 01-Home or Self Care       Discharge Instructions     Call MD for:  difficulty breathing, headache or visual disturbances   Complete by: As directed    Call MD for:  extreme fatigue   Complete by: As directed    Call MD for:  persistant dizziness or light-headedness   Complete by: As directed    Call MD for:  persistant nausea and vomiting   Complete by: As directed    Call MD for:  redness, tenderness, or signs of infection (pain, swelling, redness, odor or green/yellow discharge around incision site)   Complete by: As directed    Call MD for:  severe uncontrolled pain   Complete by: As directed    Call MD for:  temperature >100.4   Complete by: As directed    Increase activity  slowly   Complete by: As directed       Allergies as of 03/09/2022       Reactions   Amoxicillin Hives   Penicillins Hives   Sulfa Antibiotics Hives   Ceclor [cefaclor] Rash        Medication List     STOP taking these medications    ciprofloxacin 500 MG tablet Commonly known as: CIPRO       TAKE these medications    aspirin-acetaminophen-caffeine 250-250-65 MG tablet Commonly known as: EXCEDRIN MIGRAINE Take 2 tablets by mouth every 6 (six) hours as needed for headache or migraine.   ondansetron 4 MG tablet Commonly known as: ZOFRAN Take 1 tablet (4 mg total) by mouth every 6 (six) hours as needed for nausea.   oxyCODONE 5 MG immediate release tablet Commonly known as: Oxy IR/ROXICODONE Take 1 tablet (5 mg total) by mouth every 4 (four) hours as needed for severe pain or breakthrough pain.        Follow-up Information     03/11/2022, MD Follow up on 03/22/2022.   Specialty: General Surgery Why: lap chole post op phone call; if you need to be seen in person call the office Contact information: [address] [facility] [phone]                 Signed: [phone] 03/09/2022, 8:42 AM

## 2022-03-08 NOTE — Anesthesia Procedure Notes (Signed)
Procedure Name: Intubation Date/Time: 03/08/2022 12:46 PM  Performed by: Lucinda Dell, CRNAPre-anesthesia Checklist: Patient identified, Emergency Drugs available, Suction available and Patient being monitored Patient Re-evaluated:Patient Re-evaluated prior to induction Oxygen Delivery Method: Circle system utilized Preoxygenation: Pre-oxygenation with 100% oxygen Induction Type: IV induction Ventilation: Mask ventilation without difficulty Laryngoscope Size: Mac and 3 Grade View: Grade I Tube type: Oral Tube size: 7.0 mm Number of attempts: 1 Airway Equipment and Method: Stylet Placement Confirmation: ETT inserted through vocal cords under direct vision, positive ETCO2 and breath sounds checked- equal and bilateral Secured at: 21 cm Tube secured with: Tape Dental Injury: Teeth and Oropharynx as per pre-operative assessment

## 2022-03-08 NOTE — Op Note (Signed)
Operative Note   Preoperative Diagnosis: Symptomatic cholelithiasis possible cholecystitis    Postoperative Diagnosis: Cholelithiasis with acute cholecystitis    Procedure(s) Performed: Laparoscopic cholecystectomy   Surgeon: Leatrice Jewels. Henreitta Leber, MD   Assistants: No qualified resident was available   Anesthesia: General endotracheal   Anesthesiologist: Windell Norfolk, MD    Specimens: Gallbladder    Estimated Blood Loss: Minimal    Blood Replacement: None    Complications: None    Operative Findings: Edematous gallbladder consistent with cholecystitis    Procedure: The patient was taken to the operating room and placed supine. General endotracheal anesthesia was induced. Intravenous antibiotics were administered per protocol. An orogastric tube positioned to decompress the stomach. The abdomen was prepared and draped in the usual sterile fashion.    A supraumbilical incision was made and a Veress technique was utilized to achieve pneumoperitoneum to 15 mmHg with carbon dioxide. A 11 mm optiview port was placed through the supraumbilical region, and a 10 mm 0-degree operative laparoscope was introduced. The area underlying the trocar and Veress needle were inspected and without evidence of injury.  Remaining trocars were placed under direct vision. Two 5 mm ports were placed in the right abdomen, between the anterior axillary and midclavicular line.  A final 11 mm port was placed through the mid-epigastrium, near the falciform ligament.    The gallbladder fundus was elevated cephalad and the infundibulum was retracted to the patient's right. The gallbladder/cystic duct junction was skeletonized. The cystic artery noted in the triangle of Calot and was also skeletonized.  We then continued liberal medial and lateral dissection until the critical view of safety was achieved.    The cystic duct and cystic artery were triply clipped and divided. The gallbladder was then dissected from the  liver bed with electrocautery. The specimen was placed in an Endopouch and was retrieved through the epigastric site.   Final inspection revealed acceptable hemostasis. Surgicel Powder and SNOW was placed in the gallbladder bed.  Trocars were removed and pneumoperitoneum was released.  0 Vicryl fascial sutures were used to close the epigastric and umbilical port sites. Skin incisions were closed with 4-0 Monocryl subcuticular sutures and Dermabond. The patient was awakened from anesthesia and extubated without complication.    Algis Greenhouse, MD [facility] [address] [place], [place] [address] [phone] (office)
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
## 2022-03-08 NOTE — Anesthesia Preprocedure Evaluation (Addendum)
Anesthesia Evaluation  Patient identified by MRN, date of birth, ID band Patient awake    Reviewed: Allergy & Precautions, H&P , NPO status , Patient's Chart, lab work & pertinent test results, reviewed documented beta blocker date and time   Airway Mallampati: II  TM Distance: >3 FB Neck ROM: Full    Dental no notable dental hx. (+) Teeth Intact, Dental Advisory Given   Pulmonary neg pulmonary ROS,    Pulmonary exam normal breath sounds clear to auscultation       Cardiovascular Exercise Tolerance: Good negative cardio ROS   Rhythm:Regular Rate:Normal     Neuro/Psych Anxiety Depression negative neurological ROS  negative psych ROS   GI/Hepatic negative GI ROS, Neg liver ROS,   Endo/Other  negative endocrine ROS  Renal/GU negative Renal ROS  negative genitourinary   Musculoskeletal   Abdominal (+) + obese,   Peds  Hematology negative hematology ROS (+)   Anesthesia Other Findings   Reproductive/Obstetrics negative OB ROS                           Anesthesia Physical Anesthesia Plan  ASA: 2  Anesthesia Plan: General and General ETT   Post-op Pain Management:    Induction: Intravenous  PONV Risk Score and Plan: 2 and Ondansetron  Airway Management Planned: Oral ETT  Additional Equipment:   Intra-op Plan:   Post-operative Plan: Extubation in OR  Informed Consent: I have reviewed the patients History and Physical, chart, labs and discussed the procedure including the risks, benefits and alternatives for the proposed anesthesia with the patient or authorized representative who has indicated his/her understanding and acceptance.     Dental advisory given  Plan Discussed with: CRNA and Anesthesiologist  Anesthesia Plan Comments:        Anesthesia Quick Evaluation

## 2022-03-09 LAB — SURGICAL PATHOLOGY

## 2022-03-10 NOTE — Anesthesia Postprocedure Evaluation (Signed)
Anesthesia Post Note  Patient: [name]  Procedure(s) Performed: LAPAROSCOPIC CHOLECYSTECTOMY (Abdomen)  Patient location during evaluation: Phase II Anesthesia Type: General Level of consciousness: awake Pain management: pain level controlled Vital Signs Assessment: post-procedure vital signs reviewed and stable Respiratory status: spontaneous breathing and respiratory function stable Cardiovascular status: blood pressure returned to baseline and stable Postop Assessment: no headache and no apparent nausea or vomiting Anesthetic complications: no Comments: Late entry   No notable events documented.   Last Vitals:  Vitals:   03/08/22 2232 03/09/22 0322  BP: 107/71 114/75  Pulse: 96 99  Resp: 16 18  Temp: 36.7 C 36.7 C  SpO2: 95% 98%    Last Pain:  Vitals:   03/09/22 0144  TempSrc:   PainSc: Asleep                 Windell Norfolk

## 2022-03-11 ENCOUNTER — Encounter (HOSPITAL_COMMUNITY): Payer: Self-pay | Admitting: General Surgery

## 2022-03-22 ENCOUNTER — Ambulatory Visit (INDEPENDENT_AMBULATORY_CARE_PROVIDER_SITE_OTHER): Payer: Self-pay | Admitting: General Surgery

## 2022-03-22 DIAGNOSIS — K8000 Calculus of gallbladder with acute cholecystitis without obstruction: Secondary | ICD-10-CM

## 2022-03-22 NOTE — Progress Notes (Signed)
[facility]  I am calling the patient for post operative evaluation. This is not a billable encounter as it is under the global charges for the surgery.  The patient had a laparoscopic cholecystectomy on 03/08/22.  The patient reports that she is doing ok. The are tolerating a diet, having good pain control, and having regular Bms.  The incisions are healing well. Having some scabs. Still sore at times.  The patient has no concerns.   Pathology: A. GALLBLADDER, CHOLECYSTECTOMY:  - Chronic cholecystitis.  - Cholesterolosis.  - Cholelithiasis.   Will see the patient PRN.  Diet and activity.  Clean pool ok at 3 weeks out and ocean /lake at 4 weeks.  Algis Greenhouse, MD [facility] [address] [place], [place] [address] [phone] (office)

## 2022-12-25 ENCOUNTER — Ambulatory Visit
Admission: RE | Admit: 2022-12-25 | Discharge: 2022-12-25 | Disposition: A | Discharge status: Home / Self Care | Payer: 59 | Source: Ambulatory Visit | Attending: Urgent Care | Admitting: Urgent Care

## 2022-12-25 VITALS — BP 108/73 | HR 91 | Temp 97.8°F | Resp 18

## 2022-12-25 DIAGNOSIS — R350 Frequency of micturition: Secondary | ICD-10-CM | POA: Insufficient documentation

## 2022-12-25 DIAGNOSIS — N949 Unspecified condition associated with female genital organs and menstrual cycle: Secondary | ICD-10-CM | POA: Insufficient documentation

## 2022-12-25 DIAGNOSIS — N3001 Acute cystitis with hematuria: Secondary | ICD-10-CM

## 2022-12-25 LAB — POCT URINALYSIS DIP (MANUAL ENTRY)
Glucose, UA: 100 mg/dL — AB
Nitrite, UA: POSITIVE — AB
Protein Ur, POC: 100 mg/dL — AB
Spec Grav, UA: 1.025 (ref 1.010–1.025)
Urobilinogen, UA: 4 E.U./dL — AB
pH, UA: 5 (ref 5.0–8.0)

## 2022-12-25 MED ORDER — NITROFURANTOIN MONOHYD MACRO 100 MG PO CAPS: 100.0000 mg | ORAL_CAPSULE | Freq: Two times a day (BID) | ORAL | 0 refills | Status: DC

## 2022-12-25 NOTE — ED Triage Notes (Signed)
Patient presents to [facility] for vaginal discomfort and urinary freq since yesterday. Taking AZO. Hx of BV.

## 2022-12-25 NOTE — Discharge Instructions (Addendum)
Follow up here or with your primary care provider if your symptoms are worsening or not improving.     
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
## 2022-12-27 LAB — CERVICOVAGINAL ANCILLARY ONLY
Bacterial Vaginitis (gardnerella): POSITIVE — AB
Candida Glabrata: NEGATIVE
Candida Vaginitis: NEGATIVE
Chlamydia: NEGATIVE
Comment: NEGATIVE
Comment: NEGATIVE
Comment: NEGATIVE
Comment: NEGATIVE
Comment: NEGATIVE
Comment: NORMAL
Neisseria Gonorrhea: NEGATIVE
Trichomonas: NEGATIVE

## 2022-12-28 ENCOUNTER — Telehealth (HOSPITAL_COMMUNITY): Payer: Self-pay | Admitting: Emergency Medicine

## 2022-12-28 MED ORDER — METRONIDAZOLE 500 MG PO TABS: 500.0000 mg | ORAL_TABLET | Freq: Two times a day (BID) | ORAL | 0 refills | Status: DC

## 2022-12-30 ENCOUNTER — Telehealth: Payer: Self-pay

## 2022-12-30 MED ORDER — METRONIDAZOLE 500 MG PO TABS: 500.0000 mg | ORAL_TABLET | Freq: Two times a day (BID) | ORAL | 0 refills | Status: DC

## 2022-12-30 NOTE — Telephone Encounter (Signed)
Patient contacted Urgent Care regarding medications prescribed from previous visit. Patient requested that medications be sent to [facility] in [place] (see chart).   Patient contacted to confirm pharmacy change and sent to [facility] upon patient's request. No further questions asked.

## 2022-12-31 ENCOUNTER — Emergency Department (HOSPITAL_COMMUNITY): Payer: 59

## 2022-12-31 ENCOUNTER — Other Ambulatory Visit: Payer: Self-pay

## 2022-12-31 ENCOUNTER — Encounter (HOSPITAL_COMMUNITY): Payer: Self-pay

## 2022-12-31 ENCOUNTER — Emergency Department (HOSPITAL_COMMUNITY)
Admission: EM | Admit: 2022-12-31 | Discharge: 2022-12-31 | Disposition: A | Discharge status: Home / Self Care | Payer: 59 | Attending: Emergency Medicine | Admitting: Emergency Medicine

## 2022-12-31 DIAGNOSIS — R1030 Lower abdominal pain, unspecified: Secondary | ICD-10-CM | POA: Diagnosis present

## 2022-12-31 DIAGNOSIS — Z7982 Long term (current) use of aspirin: Secondary | ICD-10-CM | POA: Insufficient documentation

## 2022-12-31 DIAGNOSIS — N12 Tubulo-interstitial nephritis, not specified as acute or chronic: Secondary | ICD-10-CM | POA: Diagnosis not present

## 2022-12-31 LAB — URINALYSIS, ROUTINE W REFLEX MICROSCOPIC
Bacteria, UA: NONE SEEN
Bilirubin Urine: NEGATIVE
Glucose, UA: NEGATIVE mg/dL
Hgb urine dipstick: NEGATIVE
Ketones, ur: NEGATIVE mg/dL
Leukocytes,Ua: NEGATIVE
Nitrite: POSITIVE — AB
Protein, ur: 30 mg/dL — AB
Specific Gravity, Urine: 1.013 (ref 1.005–1.030)
pH: 5 (ref 5.0–8.0)

## 2022-12-31 LAB — PREGNANCY, URINE: Preg Test, Ur: NEGATIVE

## 2022-12-31 MED ORDER — CIPROFLOXACIN HCL 500 MG PO TABS: 500.0000 mg | ORAL_TABLET | Freq: Two times a day (BID) | ORAL | 0 refills | Status: AC

## 2022-12-31 MED ORDER — CIPROFLOXACIN HCL 250 MG PO TABS
500.0000 mg | ORAL_TABLET | Freq: Once | ORAL | Status: AC
  Administered 2022-12-31: 500 mg via ORAL
  Filled 2022-12-31: qty 2

## 2022-12-31 NOTE — Discharge Instructions (Addendum)
Please take the medications prescribed. We suspect right now that you have pyelonephritis.  Return to the emergency room if you start having worsening pain, fevers, chills.

## 2022-12-31 NOTE — ED Provider Notes (Signed)
[place] EMERGENCY DEPARTMENT AT [facility] Provider Note   CSN: [id number] Arrival date & time: 12/31/22  1746     History  No chief complaint on file.   Gloria Reed is a 32 y.o. female.  HPI    32 year old comes in with chief complaint of UTI-like symptoms.  Patient states that she started having some suprapubic pain last week.  She went to the urgent care and was started on antibiotics for UTI.  Despite taking the medications, her symptoms have not resolved.  She is now concerned that she could have kidney stone.  Pain is fairly constant, there is associated nausea without vomiting.  She has had multiple kidney stones in the past as well.  Patient has not had any intercourse in the last month and denies any vaginal discharge or bleeding.  Home Medications Prior to Admission medications   Medication Sig Start Date End Date Taking? Authorizing Provider  ciprofloxacin (CIPRO) 500 MG tablet Take 1 tablet (500 mg total) by mouth 2 (two) times daily. 12/31/22  Yes Derwood Kaplan, MD  aspirin-acetaminophen-caffeine (EXCEDRIN MIGRAINE) [phone] MG tablet Take 2 tablets by mouth every 6 (six) hours as needed for headache or migraine.    [provider]  metroNIDAZOLE (FLAGYL) 500 MG tablet Take 1 tablet (500 mg total) by mouth 2 (two) times daily. 12/30/22   Mickie Bail, NP  ondansetron (ZOFRAN) 4 MG tablet Take 1 tablet (4 mg total) by mouth every 6 (six) hours as needed for nausea. 03/08/22   Lucretia Roers, MD  oxyCODONE (OXY IR/ROXICODONE) 5 MG immediate release tablet Take 1 tablet (5 mg total) by mouth every 4 (four) hours as needed for severe pain or breakthrough pain. 03/08/22   Lucretia Roers, MD      Allergies    Amoxicillin, Penicillins, Sulfa antibiotics, and Ceclor [cefaclor]    Review of Systems   Review of Systems  All other systems reviewed and are negative.   Physical Exam Updated Vital Signs BP 113/78 (BP Location: Right Arm)    Pulse (!) 102   Temp 98.2 F (36.8 C) (Oral)   Resp 20   SpO2 100%  Physical Exam Vitals and nursing note reviewed.  Constitutional:      Appearance: She is well-developed.  HENT:     Head: Atraumatic.  Cardiovascular:     Rate and Rhythm: Normal rate.  Pulmonary:     Effort: Pulmonary effort is normal.  Abdominal:     Comments: Patient has tenderness over the suprapubic region  Musculoskeletal:     Cervical back: Normal range of motion and neck supple.  Skin:    General: Skin is warm and dry.  Neurological:     Mental Status: She is alert and oriented to person, place, and time.     ED Results / Procedures / Treatments   Labs (all labs ordered are listed, but only abnormal results are displayed) Labs Reviewed  URINALYSIS, ROUTINE W REFLEX MICROSCOPIC - Abnormal; Notable for the following components:      Result Value   Color, Urine AMBER (*)    Protein, ur 30 (*)    Nitrite POSITIVE (*)    All other components within normal limits  URINE CULTURE  PREGNANCY, URINE    EKG None  Radiology CT Renal Stone Study  Result Date: 12/31/2022 CLINICAL DATA:  Right side flank pain EXAM: CT ABDOMEN AND PELVIS WITHOUT CONTRAST TECHNIQUE: Multidetector CT imaging of the abdomen and pelvis was  performed following the standard protocol without IV contrast. RADIATION DOSE REDUCTION: This exam was performed according to the departmental dose-optimization program which includes automated exposure control, adjustment of the mA and/or kV according to patient size and/or use of iterative reconstruction technique. COMPARISON:  03/07/2022 FINDINGS: Lower chest: No acute abnormality Hepatobiliary: No focal liver abnormality is seen. Status post cholecystectomy. No biliary dilatation. Pancreas: No focal abnormality or ductal dilatation. Spleen: No focal abnormality.  Normal size. Adrenals/Urinary Tract: No adrenal abnormality. No focal renal abnormality. No stones or hydronephrosis. Urinary  bladder is unremarkable. Stomach/Bowel: Normal appendix. Stomach, large and small bowel grossly unremarkable. Vascular/Lymphatic: No evidence of aneurysm or adenopathy. Reproductive: Uterus and adnexa unremarkable.  No mass. Other: No free fluid or free air. Musculoskeletal: No acute bony abnormality. Postoperative changes across the SI joints bilaterally. IMPRESSION: No renal or ureteral stones.  No hydronephrosis. No acute findings. Electronically Signed   By: Charlett Nose M.D.   On: 12/31/2022 20:40    Procedures Procedures    Medications Ordered in ED Medications  ciprofloxacin (CIPRO) tablet 500 mg (500 mg Oral Given 12/31/22 2209)    ED Course/ Medical Decision Making/ A&P                             Medical Decision Making Amount and/or Complexity of Data Reviewed Labs: ordered. Radiology: ordered.  Risk Prescription drug management.   This patient presents to the ED with chief complaint(s) of lower quadrant abdominal pain, flank pain with pertinent past medical history of kidney stones and UTI.The complaint involves an extensive differential diagnosis and also carries with it a high risk of complications and morbidity.    The differential diagnosis includes : Acute cystitis, PID, TOA, ectopic pregnancy, pyelonephritis, renal stones, ovarian cyst  The initial plan is to get urine analysis, CT renal stone. If CT renal stone is reassuring, then we will proceed with additional workup.   Additional history obtained: Records reviewed  urgent care notes, previous urine analysis and antibiotic prescribed  Independent labs interpretation:  The following labs were independently interpreted: UA is nitrite positive  Independent visualization and interpretation of imaging: - I independently visualized the following imaging with scope of interpretation limited to determining acute life threatening conditions related to emergency care: CT abdomen pelvis, which revealed no evidence of  hydronephrosis, no kidney stone per radiology  Treatment and Reassessment: Patient reassessed.  Asked her again if she is having pelvic pain, any risk factor for STD any vaginal discharge or bleeding.  Patient maintained that she does not think she has STD.  She did self swab at the urgent care it was fine.  With this, my suspicion is that she likely has pyelonephritis.  Will start her on Cipro for 7 days.  Urine culture sent.  Strict ER return precautions have been discussed, and patient is agreeing with the plan and is comfortable with the workup done and the recommendations from the ER.   Final Clinical Impression(s) / ED Diagnoses Final diagnoses:  Pyelonephritis    Rx / DC Orders ED Discharge Orders          Ordered    ciprofloxacin (CIPRO) 500 MG tablet  2 times daily        12/31/22 2216              Derwood Kaplan, MD 12/31/22 2218

## 2022-12-31 NOTE — ED Triage Notes (Signed)
Pt states she went to UC last Sunday and diagnosed with bladder infection, given abx. Symptoms have not resolved, pain has increased over the last few days.   Pt has concerns for kidney stone due to slight R sided flank pain.   Pt has taken AZO and Excedrin.

## 2023-01-02 LAB — URINE CULTURE: Culture: <10000 — AB

## 2024-01-11 ENCOUNTER — Ambulatory Visit
Admission: RE | Admit: 2024-01-11 | Discharge: 2024-01-11 | Disposition: A | Discharge status: Home / Self Care | Payer: PRIVATE HEALTH INSURANCE | Source: Ambulatory Visit | Attending: Family Medicine | Admitting: Family Medicine

## 2024-01-11 ENCOUNTER — Other Ambulatory Visit: Payer: Self-pay

## 2024-01-11 VITALS — BP 103/74 | HR 90 | Temp 98.2°F | Resp 16

## 2024-01-11 DIAGNOSIS — J683 Other acute and subacute respiratory conditions due to chemicals, gases, fumes and vapors: Secondary | ICD-10-CM | POA: Insufficient documentation

## 2024-01-11 DIAGNOSIS — N760 Acute vaginitis: Secondary | ICD-10-CM | POA: Insufficient documentation

## 2024-01-11 MED ORDER — PREDNISONE 20 MG PO TABS: 20.0000 mg | ORAL_TABLET | Freq: Two times a day (BID) | ORAL | 0 refills | Status: AC

## 2024-01-11 MED ORDER — ALBUTEROL SULFATE HFA 108 (90 BASE) MCG/ACT IN AERS: 2.0000 | INHALATION_SPRAY | Freq: Four times a day (QID) | RESPIRATORY_TRACT | 0 refills | Status: AC | PRN

## 2024-01-11 MED ORDER — METRONIDAZOLE 500 MG PO TABS: 500.0000 mg | ORAL_TABLET | Freq: Two times a day (BID) | ORAL | 0 refills | Status: AC

## 2024-01-11 MED ORDER — FLUCONAZOLE 150 MG PO TABS: 150.0000 mg | ORAL_TABLET | ORAL | 0 refills | Status: AC | PRN
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
## 2024-01-11 NOTE — Discharge Instructions (Addendum)
 Will go ahead and cover you empirically for possible vaginitis due to BV and yeast.  Start metronidazole  500 mg twice daily for 7 days BV and Diflucan  you may start on day 3 and repeat in 3 days if needed.  Use albuterol  inhaler 2 puffs every 4-6 hours as needed for any chest tightness and shortness of breath.  I am treating with prednisone  for what appears to be a secondary bronchitis related to the illness that you had last week.  Start prednisone  20 mg twice daily for 5 days.  If symptoms improve follow-up with your primary care doctor return for reevaluation.  As tightness or breathing worsens and is not improving with treatment prescribed go immediately to the emergency department.

## 2024-01-11 NOTE — ED Triage Notes (Signed)
 PT DOB and FULL Name confirmed

## 2024-01-11 NOTE — ED Triage Notes (Signed)
 PT reports it hurts when breathing. P talso reports vag.itching

## 2024-01-12 LAB — CERVICOVAGINAL ANCILLARY ONLY
Bacterial Vaginitis (gardnerella): POSITIVE — AB
Candida Glabrata: NEGATIVE
Candida Vaginitis: POSITIVE — AB
Chlamydia: NEGATIVE
Comment: NEGATIVE
Comment: NEGATIVE
Comment: NEGATIVE
Comment: NEGATIVE
Comment: NEGATIVE
Comment: NORMAL
Neisseria Gonorrhea: NEGATIVE
Trichomonas: NEGATIVE
